# Patient Record
Sex: Female | Born: 1970 | Race: Asian | Hispanic: No | Marital: Married | Smoking: Never smoker
Health system: Southern US, Community
[De-identification: ages and names within clinical notes are randomized; demographics above are authoritative.]

## PROBLEM LIST (undated history)

## (undated) DIAGNOSIS — E079 Disorder of thyroid, unspecified: Secondary | ICD-10-CM

## (undated) DIAGNOSIS — K769 Liver disease, unspecified: Secondary | ICD-10-CM

## (undated) HISTORY — DX: Liver disease, unspecified: K76.9

## (undated) HISTORY — DX: Disorder of thyroid, unspecified: E07.9

---

## 1996-07-05 HISTORY — PX: MYOMECTOMY: SHX85

## 2007-09-01 ENCOUNTER — Emergency Department (HOSPITAL_COMMUNITY): Admission: EM | Admit: 2007-09-01 | Discharge: 2007-09-01 | Payer: Self-pay | Admitting: Emergency Medicine

## 2008-10-25 ENCOUNTER — Ambulatory Visit (HOSPITAL_COMMUNITY): Admission: RE | Admit: 2008-10-25 | Discharge: 2008-10-25 | Payer: Self-pay | Admitting: Gastroenterology

## 2009-09-25 ENCOUNTER — Encounter: Admission: RE | Admit: 2009-09-25 | Discharge: 2009-09-25 | Payer: Self-pay | Admitting: Obstetrics and Gynecology

## 2009-10-01 ENCOUNTER — Encounter: Admission: RE | Admit: 2009-10-01 | Discharge: 2009-10-01 | Payer: Self-pay | Admitting: Obstetrics and Gynecology

## 2010-07-26 ENCOUNTER — Encounter: Payer: Self-pay | Admitting: Obstetrics and Gynecology

## 2010-08-26 ENCOUNTER — Other Ambulatory Visit: Payer: Self-pay | Admitting: Obstetrics and Gynecology

## 2010-08-26 DIAGNOSIS — Z1231 Encounter for screening mammogram for malignant neoplasm of breast: Secondary | ICD-10-CM

## 2010-10-05 ENCOUNTER — Ambulatory Visit: Payer: Self-pay

## 2010-10-07 ENCOUNTER — Ambulatory Visit
Admission: RE | Admit: 2010-10-07 | Discharge: 2010-10-07 | Disposition: A | Discharge status: Home / Self Care | Payer: BC Managed Care – PPO | Source: Ambulatory Visit | Attending: Obstetrics and Gynecology | Admitting: Obstetrics and Gynecology

## 2010-10-07 DIAGNOSIS — Z1231 Encounter for screening mammogram for malignant neoplasm of breast: Secondary | ICD-10-CM

## 2011-09-07 ENCOUNTER — Other Ambulatory Visit: Payer: Self-pay | Admitting: Obstetrics and Gynecology

## 2011-09-07 DIAGNOSIS — Z1231 Encounter for screening mammogram for malignant neoplasm of breast: Secondary | ICD-10-CM

## 2011-09-28 ENCOUNTER — Other Ambulatory Visit: Payer: Self-pay | Admitting: Obstetrics and Gynecology

## 2011-09-28 ENCOUNTER — Other Ambulatory Visit (HOSPITAL_COMMUNITY)
Admission: RE | Admit: 2011-09-28 | Discharge: 2011-09-28 | Disposition: A | Discharge status: Home / Self Care | Payer: BC Managed Care – PPO | Source: Ambulatory Visit | Attending: Obstetrics and Gynecology | Admitting: Obstetrics and Gynecology

## 2011-09-28 DIAGNOSIS — Z124 Encounter for screening for malignant neoplasm of cervix: Secondary | ICD-10-CM | POA: Insufficient documentation

## 2011-09-28 DIAGNOSIS — Z113 Encounter for screening for infections with a predominantly sexual mode of transmission: Secondary | ICD-10-CM | POA: Insufficient documentation

## 2011-10-11 ENCOUNTER — Ambulatory Visit: Payer: BC Managed Care – PPO

## 2011-10-12 ENCOUNTER — Ambulatory Visit: Payer: BC Managed Care – PPO

## 2011-10-13 ENCOUNTER — Ambulatory Visit
Admission: RE | Admit: 2011-10-13 | Discharge: 2011-10-13 | Disposition: A | Discharge status: Home / Self Care | Payer: BC Managed Care – PPO | Source: Ambulatory Visit | Attending: Obstetrics and Gynecology | Admitting: Obstetrics and Gynecology

## 2011-10-13 DIAGNOSIS — Z1231 Encounter for screening mammogram for malignant neoplasm of breast: Secondary | ICD-10-CM

## 2013-02-15 ENCOUNTER — Other Ambulatory Visit: Payer: Self-pay | Admitting: Family Medicine

## 2013-02-15 DIAGNOSIS — Z1231 Encounter for screening mammogram for malignant neoplasm of breast: Secondary | ICD-10-CM

## 2013-02-15 DIAGNOSIS — IMO0002 Reserved for concepts with insufficient information to code with codable children: Secondary | ICD-10-CM

## 2013-02-16 ENCOUNTER — Ambulatory Visit
Admission: RE | Admit: 2013-02-16 | Discharge: 2013-02-16 | Disposition: A | Discharge status: Home / Self Care | Payer: No Typology Code available for payment source | Source: Ambulatory Visit | Attending: Family Medicine | Admitting: Family Medicine

## 2013-02-16 ENCOUNTER — Other Ambulatory Visit: Payer: BC Managed Care – PPO

## 2013-02-16 DIAGNOSIS — IMO0002 Reserved for concepts with insufficient information to code with codable children: Secondary | ICD-10-CM

## 2013-02-28 ENCOUNTER — Ambulatory Visit: Payer: BC Managed Care – PPO

## 2013-02-28 ENCOUNTER — Ambulatory Visit
Admission: RE | Admit: 2013-02-28 | Discharge: 2013-02-28 | Disposition: A | Discharge status: Home / Self Care | Payer: No Typology Code available for payment source | Source: Ambulatory Visit | Attending: Family Medicine | Admitting: Family Medicine

## 2013-02-28 DIAGNOSIS — Z1231 Encounter for screening mammogram for malignant neoplasm of breast: Secondary | ICD-10-CM

## 2013-08-24 ENCOUNTER — Other Ambulatory Visit: Payer: Self-pay | Admitting: Endocrinology

## 2013-08-24 DIAGNOSIS — E049 Nontoxic goiter, unspecified: Secondary | ICD-10-CM

## 2013-09-03 ENCOUNTER — Ambulatory Visit
Admission: RE | Admit: 2013-09-03 | Discharge: 2013-09-03 | Disposition: A | Discharge status: Home / Self Care | Payer: Managed Care, Other (non HMO) | Source: Ambulatory Visit | Attending: Endocrinology | Admitting: Endocrinology

## 2013-09-03 DIAGNOSIS — E049 Nontoxic goiter, unspecified: Secondary | ICD-10-CM

## 2013-10-31 ENCOUNTER — Other Ambulatory Visit: Payer: Self-pay

## 2013-10-31 DIAGNOSIS — Z1231 Encounter for screening mammogram for malignant neoplasm of breast: Secondary | ICD-10-CM

## 2014-03-01 ENCOUNTER — Ambulatory Visit
Admission: RE | Admit: 2014-03-01 | Discharge: 2014-03-01 | Disposition: A | Discharge status: Home / Self Care | Payer: Managed Care, Other (non HMO) | Source: Ambulatory Visit

## 2014-03-01 ENCOUNTER — Encounter (INDEPENDENT_AMBULATORY_CARE_PROVIDER_SITE_OTHER): Payer: Self-pay

## 2014-03-01 DIAGNOSIS — Z1231 Encounter for screening mammogram for malignant neoplasm of breast: Secondary | ICD-10-CM

## 2015-06-03 ENCOUNTER — Other Ambulatory Visit: Payer: Self-pay

## 2015-06-03 DIAGNOSIS — Z1231 Encounter for screening mammogram for malignant neoplasm of breast: Secondary | ICD-10-CM

## 2015-06-09 ENCOUNTER — Ambulatory Visit
Admission: RE | Admit: 2015-06-09 | Discharge: 2015-06-09 | Disposition: A | Discharge status: Home / Self Care | Payer: 59 | Source: Ambulatory Visit

## 2015-06-09 DIAGNOSIS — Z1231 Encounter for screening mammogram for malignant neoplasm of breast: Secondary | ICD-10-CM

## 2016-06-11 ENCOUNTER — Other Ambulatory Visit: Payer: Self-pay | Admitting: Internal Medicine

## 2016-06-11 ENCOUNTER — Ambulatory Visit (INDEPENDENT_AMBULATORY_CARE_PROVIDER_SITE_OTHER): Payer: 59 | Admitting: Internal Medicine

## 2016-06-11 ENCOUNTER — Ambulatory Visit
Admission: RE | Admit: 2016-06-11 | Discharge: 2016-06-11 | Disposition: A | Discharge status: Home / Self Care | Payer: 59 | Source: Ambulatory Visit | Attending: Internal Medicine | Admitting: Internal Medicine

## 2016-06-11 ENCOUNTER — Other Ambulatory Visit: Payer: Self-pay | Admitting: Obstetrics and Gynecology

## 2016-06-11 ENCOUNTER — Ambulatory Visit
Admission: RE | Admit: 2016-06-11 | Discharge: 2016-06-11 | Disposition: A | Discharge status: Home / Self Care | Payer: 59 | Source: Ambulatory Visit | Attending: Obstetrics and Gynecology | Admitting: Obstetrics and Gynecology

## 2016-06-11 ENCOUNTER — Encounter: Payer: Self-pay | Admitting: Internal Medicine

## 2016-06-11 VITALS — BP 113/72 | HR 72 | Ht 66.0 in | Wt 149.0 lb

## 2016-06-11 DIAGNOSIS — E042 Nontoxic multinodular goiter: Secondary | ICD-10-CM

## 2016-06-11 DIAGNOSIS — E0590 Thyrotoxicosis, unspecified without thyrotoxic crisis or storm: Secondary | ICD-10-CM | POA: Diagnosis not present

## 2016-06-11 DIAGNOSIS — Z1231 Encounter for screening mammogram for malignant neoplasm of breast: Secondary | ICD-10-CM

## 2016-06-11 DIAGNOSIS — R635 Abnormal weight gain: Secondary | ICD-10-CM

## 2016-06-11 DIAGNOSIS — E0510 Thyrotoxicosis with toxic single thyroid nodule without thyrotoxic crisis or storm: Secondary | ICD-10-CM | POA: Diagnosis not present

## 2016-06-11 LAB — T4, FREE: Free T4: 0.94 ng/dL (ref 0.60–1.60)

## 2016-06-11 LAB — TSH: TSH: 0.41 u[IU]/mL (ref 0.35–4.50)

## 2016-06-11 LAB — T3, FREE: T3, Free: 3 pg/mL (ref 2.3–4.2)

## 2016-06-11 NOTE — Progress Notes (Signed)
Patient ID: Lacey White, female   DOB: Nov 15, 1970, 45 y.o.   MRN: [id number]    HPI  Lacey White is a 45 y.o.-year-old female, referred by her PCP, Dr. Christain [place], for evaluation for subclinical thyrotoxicosis, toxic L adenoma, and multinodular thyroid.  I reviewed pt's thyroid tests: 03/18/2016: TSH 0.153 (0.2-4.5), free T4 1.1, free T3 3.57 03/18/2015: TSH 0.54 , free T4 1.39, free T3 3.07  08/23/2014: TSH 1.29, free T4 1.34, free T3 2.94 05/08/2014: TSH 0.75, free T4 1.25, free T3 2.87 02/20/2014: TSH 0.29, free T4 1.16, free T3 2.88 05/02/2013: TSH 0.86 (0.45-4.5), free T4 1.41, free T3 2.73  Thyroid Uptake and scan (03/26/2014): normal uptake, 20.9%; L hot nodule  Latest thyroid U/S (09/03/2013):  Right thyroid lobe: 66 x 14 x 21 mm. Innumerable hypoechoic/ cystic nodules.  7 x 5.5 x 8.8 mm superior pole - largest 9 x 7 x 11 mm mid lobe 6 x 8 x 8 mm inferior pole Left thyroid lobe: 67 x 14 x 24 mm. Multiple hypoechoic/cystic nodules. 10 x 5 x 10 mm complex, mid lobe - largest 9 x 9 x 9 mm solid, inferior pole Isthmus Thickness: 2.3 mm.  No nodules visualized. Lymphadenopathy: None visualized.  Pt denies feeling nodules in neck, hoarseness, dysphagia/odynophagia, SOB with lying down; she c/o: - + anxiety - + weight gain (14 lbs in last 2 years) - no  fatigue - excessive sweating/heat intolerance - no tremors - no palpitations - no hyperdefecation - + hair loss  Pt does not have a FH of thyroid ds. No FH of thyroid cancer. No h/o radiation tx to head or neck.  No seaweed or kelp now, but was eating more in the recent past - stopped 1 mo ago, no recent contrast studies. No steroid use. No herbal supplements. No Biotin use.  Pt. also has a history of Hep B - Dr. Posey Pronto.   ROS: Constitutional: + see HPI Eyes: no blurry vision, no xerophthalmia ENT: + sore throat,see HPI Cardiovascular: + CP (asthma)/SOB/palpitations/leg swelling Respiratory: no cough/no  SOB Gastrointestinal: no N/V/D/C Musculoskeletal: no muscle/joint aches Skin: no rashes, + hair loss Neurological: no tremors/numbness/tingling/dizziness Psychiatric: no depression/+ anxiety  Past Medical History:  Diagnosis Date  . Liver problem   . Thyroid disease    Past Surgical History:  Procedure Laterality Date  . MYOMECTOMY  1998   Social History   Social History  . Marital status: Married    Spouse name: N/A  . Number of children: 2   Occupational History  . Housekeeping   Social History Main Topics  . Smoking status: Never Smoker  . Smokeless tobacco: Never Used  . Alcohol use No  . Drug use: No   Prior to Admission medications   Medication Sig Start Date End Date Taking? Authorizing Provider  tenofovir (VIREAD) 300 MG tablet Take 300 mg by mouth daily.   Yes Historical Provider, MD   Allergies  Allergen Reactions  . Other     TREE ALLERGY   No family history on file.  PE: BP 113/72   Pulse 72   Ht 5\' 6"  (1.676 m)   Wt 149 lb (67.6 kg)   BMI 24.05 kg/m  Wt Readings from Last 3 Encounters:  06/11/16 149 lb (67.6 kg)   Constitutional: normal weight, in NAD Eyes: PERRLA, EOMI ENT: moist mucous membranes, no thyromegaly, no cervical lymphadenopathy Cardiovascular: RRR, No MRG Respiratory: CTA B Gastrointestinal: abdomen soft, NT, ND, BS+ Musculoskeletal: no deformities, strength intact in  all 4 Skin: moist, warm, no rashes Neurological: no tremor with outstretched hands, DTR normal in all 4  ASSESSMENT: 1. Subclinical Hyperthyroidism  2. Toxic L adenoma  3. Multinodular thyroid  4. Weight gain  PLAN:  1. And 2. Patient with mild intermittent subclinical hyperthyroidism without thyrotoxic sxs, except for anxiety.  She has a history of mildly decreased TSH in 2015, after which she had a thyroid uptake and scan indicating a left toxic adenoma. A thyroid ultrasound showed multiple small thyroid nodules. Her TFTs normalized afterwards until  03/2016, when the TSH again returned mildly low, with normal free T4 and free T3. I explained that she has mild subclinical hyperthyroidism, most likely due to her toxic adenoma. - I suggested that we check the TSH, fT3 and fT4  - If the labs are normal, she only needs follow-up, without intervention, especially since she is asymptomatic. If the labs returned abnormal and worsening, we discussed about possible modalities of treatment for toxic adenoma, to include radioactive iodine ablation or (last resort) surgery. We discussed about the possible need for levothyroxine after RAI treatment or surgery. - I do not feel that we need to add beta blockers at this time, since she is not tachycardic, anxious, or tremulous - I advised her to join my chart to communicate easier - RTC in 6 months, but likely sooner for repeat labs  3. Multinodular thyroid - She has no neck compression symptoms - I will continue to keep an eye on the thyroid nodules, with another ultrasound next year, however, no intervention needed for now, since the nodules are small.  4. Weight gain - We discussed that her weight gain is unlikely to be related to her thyroid condition - Will refer her to nutrition  Office Visit on 06/11/2016  Component Date Value Ref Range Status  . Free T4 06/11/2016 0.94  0.60 - 1.60 ng/dL Final   Comment: Specimens from patients who are undergoing biotin therapy and /or ingesting biotin supplements may contain high levels of biotin.  The higher biotin concentration in these specimens interferes with this Free T4 assay.  Specimens that contain high levels  of biotin may cause false high results for this Free T4 assay.  Please interpret results in light of the total clinical presentation of the patient.    . T3, Free 06/11/2016 3.0  2.3 - 4.2 pg/mL Final  . TSH 06/11/2016 0.41  0.35 - 4.50 uIU/mL Final   Thyroid tests have normalized. I would suggest only continue to follow her for now, without  intervention. I will repeat her testing 6 months.  [name], MD PhD [facility] Endocrinology

## 2016-06-11 NOTE — Patient Instructions (Signed)
You have subclinical hyperthyroidism (mild thyroid overactivity).  Please stop at the lab.  Please come back in 6 months.

## 2016-06-14 ENCOUNTER — Telehealth: Payer: Self-pay

## 2016-06-14 NOTE — Telephone Encounter (Signed)
Called patient. Gave lab results. Patient verbalized understanding.  Resent MyChart activation info.

## 2016-06-14 NOTE — Telephone Encounter (Signed)
-----   Message from [name], MD sent at 06/14/2016 12:39 PM EST ----- Lacey White, can you please call pt: Thyroid tests have normalized. I would suggest only continue to follow her for now, without intervention. I will repeat her tests in 6 months.

## 2016-07-13 ENCOUNTER — Ambulatory Visit: Payer: 59

## 2016-07-22 ENCOUNTER — Ambulatory Visit: Payer: 59 | Admitting: Skilled Nursing Facility1

## 2016-07-23 DIAGNOSIS — Z23 Encounter for immunization: Secondary | ICD-10-CM | POA: Diagnosis not present

## 2016-08-12 ENCOUNTER — Ambulatory Visit: Payer: 59 | Admitting: Registered"

## 2016-08-19 DIAGNOSIS — C220 Liver cell carcinoma: Secondary | ICD-10-CM | POA: Diagnosis not present

## 2016-10-05 DIAGNOSIS — N926 Irregular menstruation, unspecified: Secondary | ICD-10-CM | POA: Diagnosis not present

## 2016-10-05 DIAGNOSIS — E0510 Thyrotoxicosis with toxic single thyroid nodule without thyrotoxic crisis or storm: Secondary | ICD-10-CM | POA: Diagnosis not present

## 2016-10-05 DIAGNOSIS — E042 Nontoxic multinodular goiter: Secondary | ICD-10-CM | POA: Diagnosis not present

## 2016-10-05 DIAGNOSIS — R946 Abnormal results of thyroid function studies: Secondary | ICD-10-CM | POA: Diagnosis not present

## 2016-10-07 DIAGNOSIS — N926 Irregular menstruation, unspecified: Secondary | ICD-10-CM | POA: Diagnosis not present

## 2016-10-07 DIAGNOSIS — J302 Other seasonal allergic rhinitis: Secondary | ICD-10-CM | POA: Diagnosis not present

## 2016-10-19 DIAGNOSIS — E0590 Thyrotoxicosis, unspecified without thyrotoxic crisis or storm: Secondary | ICD-10-CM | POA: Diagnosis not present

## 2016-10-19 DIAGNOSIS — E0510 Thyrotoxicosis with toxic single thyroid nodule without thyrotoxic crisis or storm: Secondary | ICD-10-CM | POA: Diagnosis not present

## 2016-10-19 DIAGNOSIS — E042 Nontoxic multinodular goiter: Secondary | ICD-10-CM | POA: Diagnosis not present

## 2016-12-10 ENCOUNTER — Telehealth: Payer: Self-pay | Admitting: Internal Medicine

## 2016-12-10 ENCOUNTER — Encounter: Payer: Self-pay | Admitting: Internal Medicine

## 2016-12-10 ENCOUNTER — Ambulatory Visit (INDEPENDENT_AMBULATORY_CARE_PROVIDER_SITE_OTHER): Payer: 59 | Admitting: Internal Medicine

## 2016-12-10 VITALS — BP 102/60 | HR 82 | Wt 141.0 lb

## 2016-12-10 DIAGNOSIS — E0590 Thyrotoxicosis, unspecified without thyrotoxic crisis or storm: Secondary | ICD-10-CM | POA: Diagnosis not present

## 2016-12-10 DIAGNOSIS — E0510 Thyrotoxicosis with toxic single thyroid nodule without thyrotoxic crisis or storm: Secondary | ICD-10-CM | POA: Diagnosis not present

## 2016-12-10 DIAGNOSIS — E042 Nontoxic multinodular goiter: Secondary | ICD-10-CM | POA: Diagnosis not present

## 2016-12-10 LAB — T4, FREE: FREE T4: 0.96 ng/dL (ref 0.60–1.60)

## 2016-12-10 LAB — TSH: TSH: 0.61 u[IU]/mL (ref 0.35–4.50)

## 2016-12-10 LAB — T3, FREE: T3 FREE: 3.1 pg/mL (ref 2.3–4.2)

## 2016-12-10 NOTE — Patient Instructions (Addendum)
Please stop at the lab.  You can start Hair Skin and Nails vitamins but don't forget to stop 5 days before our next visit or any thyroid labs.   Please come back for a follow-up appointment in 6 months.

## 2016-12-10 NOTE — Telephone Encounter (Signed)
Patient needs order to get her yearly ultrasound for her Thyroid.   Dr. Cruzita Lederer did not put one in for her this year.  Please call back to discuss.  Thank you,  -LL

## 2016-12-10 NOTE — Progress Notes (Signed)
Patient ID: Lacey White, female   DOB: Jul 02, 1971, 46 y.o.   MRN: [id number]    HPI  Lacey White is a 46 y.o.-year-old female, initially referred by her PCP, Dr. Christain [place], now returning for subclinical thyrotoxicosis, toxic L adenoma, and multinodular thyroid.  Since last visit >> she lost 8 lbs >> intentional >> more active.  I reviewed pt's thyroid tests - last set normal: Lab Results  Component Value Date   TSH 0.41 06/11/2016   Lab Results  Component Value Date   FREET4 0.94 06/11/2016   Lab Results  Component Value Date   T3FREE 3.0 06/11/2016   Prev: 03/18/2016: TSH 0.153 (0.2-4.5), free T4 1.1, free T3 3.57 03/18/2015: TSH 0.54 , free T4 1.39, free T3 3.07  08/23/2014: TSH 1.29, free T4 1.34, free T3 2.94 05/08/2014: TSH 0.75, free T4 1.25, free T3 2.87 02/20/2014: TSH 0.29, free T4 1.16, free T3 2.88 05/02/2013: TSH 0.86 (0.45-4.5), free T4 1.41, free T3 2.73  Thyroid Uptake and scan (03/26/2014): normal uptake, 20.9%;L hot nodule  Latest thyroid U/S (09/03/2013):  Right thyroid lobe: 66 x 14 x 21 mm. Innumerable hypoechoic/ cystic nodules.  7 x 5.5 x 8.8 mm superior pole - largest 9 x 7 x 11 mm mid lobe 6 x 8 x 8 mm inferior pole Left thyroid lobe: 67 x 14 x 24 mm. Multiple hypoechoic/cystic nodules. 10 x 5 x 10 mm complex, mid lobe - largest 9 x 9 x 9 mm solid, inferior pole Isthmus Thickness: 2.3 mm.  No nodules visualized. Lymphadenopathy: None visualized.  Pt denies: - feeling nodules in neck - hoarseness - dysphagia - choking - SOB with lying down  Pt mentions - + weight loss - intentional - she goes with her son (28 y/o) to play golf (she pushes the cart) - no heat intolerance - no tremors - no palpitations - + anxiety - a little better - hyperdefecation - + hair loss - + skipped 03 and 10/2016 cycles - had a longer period of bleeding in 11/2016.  Pt does not have a FH of thyroid ds. No FH of thyroid cancer. No h/o radiation tx to head or  neck.  No recent contrast studies. No herbal supplements. No Biotin use. No recent steroids use.   No seaweed or kelp now, but was eating more in the recent past - stopped 1 mo before last visit  Pt. also has a history of Hep B - Dr. Posey Pronto.   ROS: Constitutional: see HPi Eyes: no blurry vision, no xerophthalmia ENT: no sore throat, no nodules palpated in throat, no dysphagia, no odynophagia, no hoarseness Cardiovascular: no CP/no SOB/no palpitations/no leg swelling Respiratory: no cough/no SOB/no wheezing Gastrointestinal: no N/no V/no D/no C/no acid reflux Musculoskeletal: no muscle aches/no joint aches Skin: no rashes, + hair loss Neurological: no tremors/no numbness/no tingling/no dizziness  I reviewed pt's medications, allergies, PMH, social hx, family hx, and changes were documented in the history of present illness. Otherwise, unchanged from my initial visit note.   Past Medical History:  Diagnosis Date  . Liver problem   . Thyroid disease    Past Surgical History:  Procedure Laterality Date  . MYOMECTOMY  1998   Social History   Social History  . Marital status: Married    Spouse name: N/A  . Number of children: 2   Occupational History  . Housekeeping   Social History Main Topics  . Smoking status: Never Smoker  . Smokeless tobacco: Never Used  .  Alcohol use No  . Drug use: No   Prior to Admission medications   Medication Sig Start Date End Date Taking? Authorizing Provider  tenofovir (VIREAD) 300 MG tablet Take 300 mg by mouth daily.   Yes Historical Provider, MD   Allergies  Allergen Reactions  . Other     TREE ALLERGY   No family history on file.  PE: BP 102/60 (BP Location: Left Arm, Patient Position: Sitting)   Pulse 82   Wt 141 lb (64 kg)   LMP 11/29/2016   SpO2 98%   BMI 22.76 kg/m  Wt Readings from Last 3 Encounters:  12/10/16 141 lb (64 kg)  06/11/16 149 lb (67.6 kg)   Constitutional: Normal weight, in NAD Eyes: PERRLA, EOMI, no  exophthalmos ENT: moist mucous membranes, no thyromegaly, no cervical lymphadenopathy Cardiovascular: RRR, No MRG Respiratory: CTA B Gastrointestinal: abdomen soft, NT, ND, BS+ Musculoskeletal: no deformities, strength intact in all 4 Skin: moist, warm, no rashes Neurological: no tremor with outstretched hands, DTR normal in all 4   ASSESSMENT: 1. Subclinical Hyperthyroidism  2. Toxic L adenoma  3. Multinodular thyroid  PLAN:  1. And 2. Patient with mild intermittent subclinical hyperthyroidism without thyrotoxic sxs, except for slight anxiety. She also has irregular menstrual cycles now, but she is likely perimenopausal. She had an intentional weight loss of 8 pounds since last visit after being more active. - We discussed about the results of her thyroid uptake and scan indicating the left mildly toxic adenoma, however, her TFTs are either normal or her TSH is very slightly low. At last visit, her TFTs were all normal. We discussed for now to continue to follow her, but I would not suggest treatment at the moment (standard of care in this situation is RAI treatment), since that would most likely commit her to lifelong levothyroxine treatment. She agrees with the plan.  - today we will check TSH, fT3 and fT4  - I do not feel the need to add beta blockers at this time, since she is not tachycardic or tremulous. - RTC in 6 months  3. Multinodular thyroid - no neck compression sxs - reviewed the last U/S results >> nodules are small >> will continue to keep an eye on them >> will need a new U/S at next visit  Office Visit on 12/10/2016  Component Date Value Ref Range Status  . TSH 12/10/2016 0.61  0.35 - 4.50 uIU/mL Final  . Free T4 12/10/2016 0.96  0.60 - 1.60 ng/dL Final   Comment: Specimens from patients who are undergoing biotin therapy and /or ingesting biotin supplements may contain high levels of biotin.  The higher biotin concentration in these specimens interferes with this  Free T4 assay.  Specimens that contain high levels  of biotin may cause false high results for this Free T4 assay.  Please interpret results in light of the total clinical presentation of the patient.    . T3, Free 12/10/2016 3.1  2.3 - 4.2 pg/mL Final   Thyroid tests are all normal.  [name], MD PhD [facility] Endocrinology

## 2016-12-13 ENCOUNTER — Telehealth: Payer: Self-pay

## 2016-12-13 ENCOUNTER — Other Ambulatory Visit: Payer: Self-pay | Admitting: Internal Medicine

## 2016-12-13 NOTE — Telephone Encounter (Signed)
Called and LVM advising patient of Dr.Gherghe's note. Left call back number if any questions.   

## 2016-12-13 NOTE — Telephone Encounter (Signed)
Please advise. Thank you

## 2016-12-13 NOTE — Telephone Encounter (Signed)
Please see my last OV note: - reviewed the last U/S results >> nodules are small >> will continue to keep an eye on them >> will need a new U/S at next visit  The reason for this is that I would like her to be euthyroid for 1 year when we get the new ultrasound. Her last abnormal TFTs were in 03/2016, so I plan to order her next ultrasound when she comes back.

## 2017-03-15 DIAGNOSIS — R5383 Other fatigue: Secondary | ICD-10-CM | POA: Diagnosis not present

## 2017-03-15 DIAGNOSIS — E0510 Thyrotoxicosis with toxic single thyroid nodule without thyrotoxic crisis or storm: Secondary | ICD-10-CM | POA: Diagnosis not present

## 2017-03-15 DIAGNOSIS — J029 Acute pharyngitis, unspecified: Secondary | ICD-10-CM | POA: Diagnosis not present

## 2017-03-15 DIAGNOSIS — E042 Nontoxic multinodular goiter: Secondary | ICD-10-CM | POA: Diagnosis not present

## 2017-03-15 DIAGNOSIS — R6883 Chills (without fever): Secondary | ICD-10-CM | POA: Diagnosis not present

## 2017-03-16 DIAGNOSIS — R7301 Impaired fasting glucose: Secondary | ICD-10-CM | POA: Diagnosis not present

## 2017-06-08 ENCOUNTER — Other Ambulatory Visit: Payer: Self-pay | Admitting: Nurse Practitioner

## 2017-06-08 DIAGNOSIS — Z1231 Encounter for screening mammogram for malignant neoplasm of breast: Secondary | ICD-10-CM

## 2017-06-10 ENCOUNTER — Ambulatory Visit: Payer: 59 | Admitting: Internal Medicine

## 2017-07-11 ENCOUNTER — Telehealth: Payer: Self-pay | Admitting: Internal Medicine

## 2017-07-11 ENCOUNTER — Other Ambulatory Visit: Payer: Self-pay | Admitting: Internal Medicine

## 2017-07-11 ENCOUNTER — Ambulatory Visit: Payer: 59 | Admitting: Internal Medicine

## 2017-07-11 ENCOUNTER — Ambulatory Visit (INDEPENDENT_AMBULATORY_CARE_PROVIDER_SITE_OTHER): Payer: 59 | Admitting: Internal Medicine

## 2017-07-11 ENCOUNTER — Encounter: Payer: Self-pay | Admitting: Internal Medicine

## 2017-07-11 VITALS — BP 100/60 | HR 80 | Ht 67.25 in | Wt 153.0 lb

## 2017-07-11 DIAGNOSIS — E042 Nontoxic multinodular goiter: Secondary | ICD-10-CM | POA: Diagnosis not present

## 2017-07-11 DIAGNOSIS — Z6824 Body mass index (BMI) 24.0-24.9, adult: Secondary | ICD-10-CM | POA: Diagnosis not present

## 2017-07-11 DIAGNOSIS — Z124 Encounter for screening for malignant neoplasm of cervix: Secondary | ICD-10-CM | POA: Diagnosis not present

## 2017-07-11 DIAGNOSIS — Z01419 Encounter for gynecological examination (general) (routine) without abnormal findings: Secondary | ICD-10-CM | POA: Diagnosis not present

## 2017-07-11 DIAGNOSIS — E0510 Thyrotoxicosis with toxic single thyroid nodule without thyrotoxic crisis or storm: Secondary | ICD-10-CM

## 2017-07-11 DIAGNOSIS — E0590 Thyrotoxicosis, unspecified without thyrotoxic crisis or storm: Secondary | ICD-10-CM

## 2017-07-11 LAB — T4, FREE: Free T4: 0.76 ng/dL (ref 0.60–1.60)

## 2017-07-11 LAB — T3, FREE: T3, Free: 3 pg/mL (ref 2.3–4.2)

## 2017-07-11 LAB — TSH: TSH: 0.45 u[IU]/mL (ref 0.35–4.50)

## 2017-07-11 NOTE — Telephone Encounter (Signed)
Unfortunately, she has no violent is another medical system, I cannot order an ultrasound there.  Can she get her PCP to order it there?

## 2017-07-11 NOTE — Patient Instructions (Signed)
Please stop at the lab.  Ordered a new thyroid ultrasound for you.  You will be called to schedule this.  Please come back for another visit in 1 year but for a new set of labs in 6 months.

## 2017-07-11 NOTE — Progress Notes (Addendum)
Patient ID: Lacey White, female   DOB: 1970-07-14, 47 y.o.   MRN: [id number]    HPI  Lacey White is a 47 y.o.-year-old female, initially referred by her PCP, Dr. Christain Sacramento, now returning for subclinical thyrotoxicosis, toxic L adenoma, and multinodular thyroid.  Last visit 6 months ago.  Since last visit >> she gained 12 pounds. She also c/o more stress, abdominal discomfort.  I reviewed pt's thyroid tests - last 2 sets normal: Lab Results  Component Value Date   TSH 0.61 12/10/2016   TSH 0.41 06/11/2016   Lab Results  Component Value Date   FREET4 0.96 12/10/2016   FREET4 0.94 06/11/2016   Lab Results  Component Value Date   T3FREE 3.1 12/10/2016   T3FREE 3.0 06/11/2016   Prev: 03/18/2016: TSH 0.153 (0.2-4.5), free T4 1.1, free T3 3.57 03/18/2015: TSH 0.54 , free T4 1.39, free T3 3.07  08/23/2014: TSH 1.29, free T4 1.34, free T3 2.94 05/08/2014: TSH 0.75, free T4 1.25, free T3 2.87 02/20/2014: TSH 0.29, free T4 1.16, free T3 2.88 05/02/2013: TSH 0.86 (0.45-4.5), free T4 1.41, free T3 2.73  Thyroid Uptake and scan (03/26/2014): normal uptake, 20.9%; left hot nodule  Latest thyroid U/S (09/03/2013):  Right thyroid lobe: 66 x 14 x 21 mm. Innumerable hypoechoic/ cystic nodules.  7 x 5.5 x 8.8 mm superior pole - largest 9 x 7 x 11 mm mid lobe 6 x 8 x 8 mm inferior pole Left thyroid lobe: 67 x 14 x 24 mm. Multiple hypoechoic/cystic nodules. 10 x 5 x 10 mm complex, mid lobe - largest 9 x 9 x 9 mm solid, inferior pole Isthmus Thickness: 2.3 mm.  No nodules visualized. Lymphadenopathy: None visualized.  Pt denies: - feeling nodules in neck - hoarseness - dysphagia - choking - SOB with lying down  Pt does not have a FH of thyroid ds. No FH of thyroid cancer. No h/o radiation tx to head or neck.  No seaweed or kelp. No recent contrast studies. No herbal supplements. No Biotin use. No recent steroids use.   Pt. also has a history of Hep B - Dr. Posey Pronto.    ROS: Constitutional: no weight gain/no weight loss, no fatigue, no subjective hyperthermia, no subjective hypothermia Eyes: no blurry vision, no xerophthalmia ENT: no sore throat, + see HPI Cardiovascular: no CP/no SOB/no palpitations/no leg swelling Respiratory: no cough/no SOB/no wheezing Gastrointestinal: no N/no V/no D/no C/no acid reflux Musculoskeletal: no muscle aches/no joint aches Skin: no rashes, no hair loss Neurological: no tremors/no numbness/no tingling/no dizziness  I reviewed pt's medications, allergies, PMH, social hx, family hx, and changes were documented in the history of present illness. Otherwise, unchanged from my initial visit note.   Past Medical History:  Diagnosis Date  . Liver problem   . Thyroid disease    Social History   Social History  . Marital status: Married    Spouse name: N/A  . Number of children: 2   Occupational History  . Housekeeping   Social History Main Topics  . Smoking status: Never Smoker  . Smokeless tobacco: Never Used  . Alcohol use No  . Drug use: No   Current Outpatient Medications  Medication Sig Dispense Refill  . Tenofovir Alafenamide Fumarate 25 MG TABS Take by mouth.     No current facility-administered medications for this visit.    Allergies  Allergen Reactions  . Other     TREE ALLERGY   No family history on file.  PE: BP  100/60   Pulse 80   Ht 5' 7.25" (1.708 m)   Wt 153 lb (69.4 kg)   LMP 07/01/2017   SpO2 98%   BMI 23.79 kg/m  Wt Readings from Last 3 Encounters:  07/11/17 153 lb (69.4 kg)  12/10/16 141 lb (64 kg)  06/11/16 149 lb (67.6 kg)   Constitutional: Normal weight, in NAD Eyes: PERRLA, EOMI, no exophthalmos ENT: moist mucous membranes, no thyromegaly, no cervical lymphadenopathy Cardiovascular: RRR, No MRG Respiratory: CTA B Gastrointestinal: abdomen soft, NT, ND, BS+ Musculoskeletal: no deformities, strength intact in all 4 Skin: moist, warm, no rashes Neurological: no  tremor with outstretched hands, DTR normal in all 4   ASSESSMENT: 1. Subclinical Hyperthyroidism  2. Toxic L adenoma  3. Multinodular thyroid  PLAN:  1. And 2.  Patient with mild intermittent subclinical hyperthyroidism, without thyrotoxic symptoms, except  for slight anxiety.  She also has irregular menstrual cycles now, but she is likely perimenopausal.  At last visit, she had an 8 pound weight loss, however, since last visit, she gained 12 pounds. - We again discussed about the results of her thyroid uptake and scan indicating left mildly toxic adenoma, however, she had normal TFTs at last visit and previously her TSH was very slightly low.  Therefore, we did not proceed with RAI treatment - today we will check TSH, free T4, free T3 - I do not feel we need to add beta-blockers at this time, since she is not tachycardic or tremulous - RTC in 6 months for labs and in 1 year for a visit  3. Multinodular thyroid - No neck compression symptoms - Reviewed together the latest ultrasound results >> nodules are small >> will order a new ultrasound now.  Office Visit on 07/11/2017  Component Date Value Ref Range Status  . TSH 07/11/2017 0.45  0.35 - 4.50 uIU/mL Final  . T3, Free 07/11/2017 3.0  2.3 - 4.2 pg/mL Final  . Free T4 07/11/2017 0.76  0.60 - 1.60 ng/dL Final   Comment: Specimens from patients who are undergoing biotin therapy and /or ingesting biotin supplements may contain high levels of biotin.  The higher biotin concentration in these specimens interferes with this Free T4 assay.  Specimens that contain high levels  of biotin may cause false high results for this Free T4 assay.  Please interpret results in light of the total clinical presentation of the patient.    Normal TFTs.  Received the thyroid ultrasound report from [place] (07/18/2017) -we will scan report: Right thyroid lobe: 4.8 x 1.2 x 2.3 cm.   Mildly heterogeneous parenchyma.  Normal background vascularity. Index  hypoechoic nodule in the upper pole measuring 1 x 0.6 x 0.8 cm, unchanged. 2 additional similar sized hypoechoic nodules in the right lobe midpole region are also not significantly changed. Isthmus: 2 mm Left thyroid lobe: 5 x 1 x 2.3 cm. Mildly heterogeneous parenchyma with normal vascularity. Index nearly isoechoic lower pole nodule measuring 1.3 x 1.0 x 0.9 cm, unchanged. Additional slightly smaller nodules are also not significantly changed IMPRESSION: Overall stable appearance of multiple nodules.  [name], MD PhD [facility] Endocrinology

## 2017-07-11 NOTE — Telephone Encounter (Addendum)
Patient stated she would like to be referred to [facility]  for her Ultra Sound  Location [place] phone # [phone]

## 2017-07-11 NOTE — Telephone Encounter (Signed)
Please advise on below  

## 2017-07-12 NOTE — Telephone Encounter (Signed)
Pt is aware and is calling her insurance to see how much she will have to pay out of pocket for this before she makes the appointment.

## 2017-07-13 NOTE — Telephone Encounter (Signed)
Pt stated she is suppose to have ultrasound done. And wants to speak to someone about getting it done elsewhere. She knows she is suppose to get the PCP to order it to the location she is wanting to get done at, but isnt sure what to tell PCP to get it sent there    Please advise Thank you

## 2017-07-13 NOTE — Telephone Encounter (Signed)
Patient is returning your call.  

## 2017-07-13 NOTE — Telephone Encounter (Signed)
LMTCB

## 2017-07-14 ENCOUNTER — Other Ambulatory Visit: Payer: Self-pay | Admitting: Internal Medicine

## 2017-07-14 DIAGNOSIS — E042 Nontoxic multinodular goiter: Secondary | ICD-10-CM

## 2017-07-14 NOTE — Telephone Encounter (Signed)
Faxed

## 2017-07-14 NOTE — Telephone Encounter (Signed)
Patient is returning your call.  

## 2017-07-14 NOTE — Telephone Encounter (Signed)
LMTCB

## 2017-07-14 NOTE — Telephone Encounter (Signed)
Pt is returning your call

## 2017-07-14 NOTE — Telephone Encounter (Signed)
Order up front for pt

## 2017-07-14 NOTE — Telephone Encounter (Signed)
Pt states she can not use [facility] because it is to expensive with her deductible. She states that she can take a written RX for the ultra sound to [facility] and they do not "charge her deductible" and she doesn't have to pay them anything. Patient stated that she will ask her primary care but she does not want to she wants a written order from Dr. Cruzita Lederer. Please advise.

## 2017-07-14 NOTE — Telephone Encounter (Signed)
Dr. Marin Comment office called about pt. And needs labs faxed over for the pt.    Fax [phone] Attn: Dr. Marin Comment

## 2017-07-14 NOTE — Telephone Encounter (Signed)
Ok, I printed the order.

## 2017-07-18 ENCOUNTER — Ambulatory Visit
Admission: RE | Admit: 2017-07-18 | Discharge: 2017-07-18 | Disposition: A | Discharge status: Home / Self Care | Payer: 59 | Source: Ambulatory Visit | Attending: Nurse Practitioner | Admitting: Nurse Practitioner

## 2017-07-18 DIAGNOSIS — E042 Nontoxic multinodular goiter: Secondary | ICD-10-CM | POA: Diagnosis not present

## 2017-07-18 DIAGNOSIS — Z1231 Encounter for screening mammogram for malignant neoplasm of breast: Secondary | ICD-10-CM

## 2017-07-21 ENCOUNTER — Encounter: Payer: Self-pay | Admitting: Internal Medicine

## 2017-07-21 NOTE — Progress Notes (Signed)
Received the thyroid ultrasound report from [place] (07/18/2017) -we will scan report: Right thyroid lobe: 4.8 x 1.2 x 2.3 cm.   Mildly heterogeneous parenchyma.  Normal background vascularity. Index hypoechoic nodule in the upper pole measuring 1 x 0.6 x 0.8 cm, unchanged. 2 additional similar sized hypoechoic nodules in the right lobe midpole region are also not significantly changed. Isthmus: 2 mm Left thyroid lobe: 5 x 1 x 2.3 cm. Mildly heterogeneous parenchyma with normal vascularity. Index nearly isoechoic lower pole nodule measuring 1.3 x 1.0 x 0.9 cm, unchanged. Additional slightly smaller nodules are also not significantly changed IMPRESSION: Overall stable appearance of multiple nodules.

## 2017-11-14 DIAGNOSIS — J358 Other chronic diseases of tonsils and adenoids: Secondary | ICD-10-CM | POA: Diagnosis not present

## 2017-11-21 DIAGNOSIS — L84 Corns and callosities: Secondary | ICD-10-CM | POA: Diagnosis not present

## 2017-11-21 DIAGNOSIS — M7742 Metatarsalgia, left foot: Secondary | ICD-10-CM | POA: Diagnosis not present

## 2017-11-21 DIAGNOSIS — M7741 Metatarsalgia, right foot: Secondary | ICD-10-CM | POA: Diagnosis not present

## 2018-01-09 ENCOUNTER — Other Ambulatory Visit: Payer: 59

## 2018-01-18 DIAGNOSIS — T63481A Toxic effect of venom of other arthropod, accidental (unintentional), initial encounter: Secondary | ICD-10-CM | POA: Diagnosis not present

## 2018-04-10 DIAGNOSIS — N938 Other specified abnormal uterine and vaginal bleeding: Secondary | ICD-10-CM | POA: Diagnosis not present

## 2018-04-10 DIAGNOSIS — N939 Abnormal uterine and vaginal bleeding, unspecified: Secondary | ICD-10-CM | POA: Diagnosis not present

## 2018-07-10 ENCOUNTER — Encounter: Payer: Self-pay | Admitting: Internal Medicine

## 2018-07-10 ENCOUNTER — Ambulatory Visit: Payer: 59 | Admitting: Internal Medicine

## 2018-07-10 VITALS — BP 110/70 | HR 82 | Ht 66.5 in | Wt 150.0 lb

## 2018-07-10 DIAGNOSIS — E0510 Thyrotoxicosis with toxic single thyroid nodule without thyrotoxic crisis or storm: Secondary | ICD-10-CM | POA: Diagnosis not present

## 2018-07-10 DIAGNOSIS — E0590 Thyrotoxicosis, unspecified without thyrotoxic crisis or storm: Secondary | ICD-10-CM | POA: Diagnosis not present

## 2018-07-10 DIAGNOSIS — E042 Nontoxic multinodular goiter: Secondary | ICD-10-CM | POA: Diagnosis not present

## 2018-07-10 NOTE — Progress Notes (Signed)
Patient ID: Lacey White, female   DOB: 12-Sep-1970, 48 y.o.   MRN: [id number]    HPI  Lacey White is a 48 y.o.-year-old female, returning for follow-up for subclinical thyrotoxicosis, toxic L adenoma, and multinodular thyroid.  Last visit 1 year ago.  Reviewed patient's TFTs: Normal: Lab Results  Component Value Date   TSH 0.45 07/11/2017   TSH 0.61 12/10/2016   TSH 0.41 06/11/2016   Lab Results  Component Value Date   FREET4 0.76 07/11/2017   FREET4 0.96 12/10/2016   FREET4 0.94 06/11/2016   Lab Results  Component Value Date   T3FREE 3.0 07/11/2017   T3FREE 3.1 12/10/2016   T3FREE 3.0 06/11/2016   Prev: 03/18/2016: TSH 0.153 (0.2-4.5), free T4 1.1, free T3 3.57 03/18/2015: TSH 0.54 , free T4 1.39, free T3 3.07  08/23/2014: TSH 1.29, free T4 1.34, free T3 2.94 05/08/2014: TSH 0.75, free T4 1.25, free T3 2.87 02/20/2014: TSH 0.29, free T4 1.16, free T3 2.88 05/02/2013: TSH 0.86 (0.45-4.5), free T4 1.41, free T3 2.73  Thyroid Uptake and scan (03/26/2014): normal uptake, 20.9%; left hot nodule  Thyroid U/S (09/03/2013):  Right thyroid lobe: 66 x 14 x 21 mm. Innumerable hypoechoic/ cystic nodules.  7 x 5.5 x 8.8 mm superior pole - largest 9 x 7 x 11 mm mid lobe 6 x 8 x 8 mm inferior pole Left thyroid lobe: 67 x 14 x 24 mm. Multiple hypoechoic/cystic nodules. 10 x 5 x 10 mm complex, mid lobe - largest 9 x 9 x 9 mm solid, inferior pole Isthmus Thickness: 2.3 mm.  No nodules visualized. Lymphadenopathy: None visualized.  Received the thyroid ultrasound report from [place] (07/18/2017): Right thyroid lobe: 4.8 x 1.2 x 2.3 cm.   Mildly heterogeneous parenchyma.  Normal background vascularity. Index hypoechoic nodule in the upper pole measuring 1 x 0.6 x 0.8 cm, unchanged. 2 additional similar sized hypoechoic nodules in the right lobe midpole region are also not significantly changed. Isthmus: 2 mm Left thyroid lobe: 5 x 1 x 2.3 cm. Mildly heterogeneous parenchyma with  normal vascularity. Index nearly isoechoic lower pole nodule measuring 1.3 x 1.0 x 0.9 cm, unchanged. Additional slightly smaller nodules are also not significantly changed IMPRESSION: Overall stable appearance of multiple nodules.  Pt denies: - feeling nodules in neck - hoarseness - dysphagia - choking - SOB with lying down  Pt does not have a FH of thyroid ds. No FH of thyroid cancer. No h/o radiation tx to head or neck.  No seaweed or kelp. No recent contrast studies. + herbal supplements - kudzu, soy isoflavones, etc. + Biotin use in MVI  - 100 mcg (took these today). No recent steroids use.   Pt. also has a history of Hep B - Dr. Posey Pronto. On antiviral med.  She started OCPs x 2 mo for irreg. Menses.   ROS: Constitutional: no weight gain/no weight loss, no fatigue, no subjective hyperthermia, no subjective hypothermia Eyes: no blurry vision, no xerophthalmia ENT: no sore throat, + see HPI Cardiovascular: no CP/no SOB/no palpitations/no leg swelling Respiratory: no cough/no SOB/no wheezing Gastrointestinal: no N/no V/no D/no C/no acid reflux Musculoskeletal: no muscle aches/no joint aches Skin: no rashes, no hair loss Neurological: no tremors/no numbness/no tingling/no dizziness  I reviewed pt's medications, allergies, PMH, social hx, family hx, and changes were documented in the history of present illness. Otherwise, unchanged from my initial visit note.  Past Medical History:  Diagnosis Date  . Liver problem   . Thyroid disease  Social History   Social History  . Marital status: Married    Spouse name: N/A  . Number of children: 2   Occupational History  . Housekeeping   Social History Main Topics  . Smoking status: Never Smoker  . Smokeless tobacco: Never Used  . Alcohol use No  . Drug use: No   Current Outpatient Medications  Medication Sig Dispense Refill  . Tenofovir Alafenamide Fumarate 25 MG TABS Take by mouth.     No current facility-administered  medications for this visit.    Allergies  Allergen Reactions  . Other     TREE ALLERGY   No family history on file.  PE: BP 110/70   Pulse 82   Ht 5' 6.5" (1.689 m) Comment: measured  Wt 150 lb (68 kg)   LMP 06/30/2018   SpO2 98%   BMI 23.85 kg/m  Wt Readings from Last 3 Encounters:  07/10/18 150 lb (68 kg)  07/11/17 153 lb (69.4 kg)  12/10/16 141 lb (64 kg)   Constitutional: Normal weight, in NAD Eyes: PERRLA, EOMI, no exophthalmos ENT: moist mucous membranes, no thyromegaly, no cervical lymphadenopathy Cardiovascular: RRR, No MRG Respiratory: CTA B Gastrointestinal: abdomen soft, NT, ND, BS+ Musculoskeletal: no deformities, strength intact in all 4 Skin: moist, warm, no rashes Neurological: no tremor with outstretched hands, DTR normal in all 4  ASSESSMENT: 1. Subclinical Hyperthyroidism  2. Toxic L adenoma  3. Multinodular thyroid  PLAN:  1. And 2.  Patient with mild intermittent subclinical hyperthyroidism, without thyrotoxic symptoms, except for slight anxiety.  She has irregular menstrual cycles but she is likely perimenopausal.  At last visit, she had a 12 pound weight gain compared to the previous visit, however, at the previous visit she had an 8 pound weight loss. Lost 3 lbs since last OV. -We reviewed her most recent thyroid test results and explained that these are now normal.  -We also reviewed her uptake and scan that showed a left mildly toxic adenoma, however, with normal TFTs, no intervention is needed for now except for follow-up with 6 months or yearly TFTs.  I would not suggest RAI treatment for now. -We do not need to add beta-blockers at this time since she is not tachycardic or tremulous -we will repeat her TSH, free T4, free T3 in 4-5 weeks after stopping the herbal supplements - I advised her to not take her MVIs before labs that day -I will have her back in 1 year for another visit  3. Multinodular thyroid -No neck compression  symptoms -Reviewed together the latest ultrasound results from a year ago.  The nodules were small and not significantly changed from before.  I would not repeat the ultrasound for now but may repeat in 3 to 5 years.  Orders Placed This Encounter  Procedures  . TSH  . T4, free  . T3, free   Patient Instructions  Stop the Women's Choice vitamins.  Continue Multivitamins and come back for a recheck of the tests in 4-5 weeks. Do not take any supplements in the day of the lab.  Please come back for another visit 1 year.  - time spent with the patient: 25 min, of which >50% was spent in obtaining information about her symptoms, reviewing her previous labs, evaluations, and treatments, counseling her about her conditions (please see the discussed topics above), and developing a plan to further investigate and treat it; she had a number of questions which I addressed.  [name], MD PhD  [facility]

## 2018-07-10 NOTE — Patient Instructions (Signed)
Stop the Women's Choice vitamins.  Continue Multivitamins and come back for a recheck of the tests in 4-5 weeks. Do not take any supplements in the day of the lab.  Please come back for another visit 1 year.

## 2018-07-26 ENCOUNTER — Encounter: Payer: Self-pay | Admitting: Internal Medicine

## 2018-07-28 DIAGNOSIS — Z23 Encounter for immunization: Secondary | ICD-10-CM | POA: Diagnosis not present

## 2018-07-31 DIAGNOSIS — Z6824 Body mass index (BMI) 24.0-24.9, adult: Secondary | ICD-10-CM | POA: Diagnosis not present

## 2018-07-31 DIAGNOSIS — N938 Other specified abnormal uterine and vaginal bleeding: Secondary | ICD-10-CM | POA: Diagnosis not present

## 2018-07-31 DIAGNOSIS — Z01411 Encounter for gynecological examination (general) (routine) with abnormal findings: Secondary | ICD-10-CM | POA: Diagnosis not present

## 2018-08-07 ENCOUNTER — Encounter: Payer: Self-pay | Admitting: Internal Medicine

## 2018-08-08 ENCOUNTER — Other Ambulatory Visit: Payer: Self-pay | Admitting: Nurse Practitioner

## 2018-08-08 DIAGNOSIS — Z1231 Encounter for screening mammogram for malignant neoplasm of breast: Secondary | ICD-10-CM

## 2018-08-21 ENCOUNTER — Other Ambulatory Visit (INDEPENDENT_AMBULATORY_CARE_PROVIDER_SITE_OTHER): Payer: 59

## 2018-08-21 DIAGNOSIS — E0590 Thyrotoxicosis, unspecified without thyrotoxic crisis or storm: Secondary | ICD-10-CM | POA: Diagnosis not present

## 2018-08-21 DIAGNOSIS — E0510 Thyrotoxicosis with toxic single thyroid nodule without thyrotoxic crisis or storm: Secondary | ICD-10-CM | POA: Diagnosis not present

## 2018-08-22 LAB — TSH: TSH: 0.36 u[IU]/mL (ref 0.35–4.50)

## 2018-08-22 LAB — T3, FREE: T3, Free: 3.2 pg/mL (ref 2.3–4.2)

## 2018-08-22 LAB — T4, FREE: Free T4: 0.88 ng/dL (ref 0.60–1.60)

## 2018-09-06 ENCOUNTER — Ambulatory Visit: Payer: 59

## 2018-09-29 ENCOUNTER — Ambulatory Visit: Payer: 59

## 2018-12-04 ENCOUNTER — Ambulatory Visit: Payer: 59

## 2018-12-07 ENCOUNTER — Telehealth: Payer: Self-pay | Admitting: *Deleted

## 2018-12-07 NOTE — Telephone Encounter (Signed)
   I called the pt and LVM asking for a call back today if possible to confirm if she is coming into the office for her appt on Monday 6/8 @ 9:00. I advised of requirement of mask and covid19 screening questions including fever, cough, SOB, if she's been exposed to anyone positive for COVID19 or suspected of having it. I left the office number in message and advised if we do not hear back within 24 hours, we will cancel the appt and she will need to reschedule.      COVID 19 risk score 0.

## 2018-12-11 ENCOUNTER — Ambulatory Visit: Payer: 59 | Admitting: Neurology

## 2019-02-05 ENCOUNTER — Ambulatory Visit: Payer: 59

## 2019-03-19 ENCOUNTER — Ambulatory Visit: Payer: 59

## 2019-04-16 ENCOUNTER — Ambulatory Visit: Payer: 59

## 2019-06-04 ENCOUNTER — Ambulatory Visit
Admission: RE | Admit: 2019-06-04 | Discharge: 2019-06-04 | Disposition: A | Discharge status: Home / Self Care | Payer: 59 | Source: Ambulatory Visit | Attending: Nurse Practitioner | Admitting: Nurse Practitioner

## 2019-06-04 ENCOUNTER — Other Ambulatory Visit: Payer: Self-pay

## 2019-06-04 DIAGNOSIS — Z1231 Encounter for screening mammogram for malignant neoplasm of breast: Secondary | ICD-10-CM

## 2019-07-16 ENCOUNTER — Other Ambulatory Visit: Payer: Self-pay

## 2019-07-16 ENCOUNTER — Encounter: Payer: Self-pay | Admitting: Internal Medicine

## 2019-07-16 ENCOUNTER — Ambulatory Visit (INDEPENDENT_AMBULATORY_CARE_PROVIDER_SITE_OTHER): Payer: 59 | Admitting: Internal Medicine

## 2019-07-16 DIAGNOSIS — E0510 Thyrotoxicosis with toxic single thyroid nodule without thyrotoxic crisis or storm: Secondary | ICD-10-CM

## 2019-07-16 DIAGNOSIS — E042 Nontoxic multinodular goiter: Secondary | ICD-10-CM | POA: Diagnosis not present

## 2019-07-16 DIAGNOSIS — E0590 Thyrotoxicosis, unspecified without thyrotoxic crisis or storm: Secondary | ICD-10-CM | POA: Diagnosis not present

## 2019-07-16 NOTE — Patient Instructions (Signed)
Please come to the lab for labs at your convenience.  Please come back for another visit 1 year.

## 2019-07-16 NOTE — Progress Notes (Signed)
Patient ID: Lacey White, female   DOB: 1970-10-10, 49 y.o.   MRN: [id number]   Patient location: Home My location: Office Persons participating in the virtual visit: patient, provider  Referring Provider: Glenford Bayley, DO  I connected with the patient on 07/16/19 at 10:04 AM EST by a video enabled telemedicine application and verified that I am speaking with the correct person.   I discussed the limitations of evaluation and management by telemedicine and the availability of in person appointments. The patient expressed understanding and agreed to proceed.   Details of the encounter are shown below.  HPI  Lacey White is a 49 y.o.-year-old female, presenting for follow-up for subclinical thyrotoxicosis, toxic L adenoma, and multinodular thyroid.  Last visit 1 year ago.  Reviewed patient's TFTs: They have been normal in the last 3 years: Lab Results  Component Value Date   TSH 0.36 08/21/2018   TSH 0.45 07/11/2017   TSH 0.61 12/10/2016   TSH 0.41 06/11/2016   Lab Results  Component Value Date   FREET4 0.88 08/21/2018   FREET4 0.76 07/11/2017   FREET4 0.96 12/10/2016   FREET4 0.94 06/11/2016   Lab Results  Component Value Date   T3FREE 3.2 08/21/2018   T3FREE 3.0 07/11/2017   T3FREE 3.1 12/10/2016   T3FREE 3.0 06/11/2016   Prev: 03/18/2016: TSH 0.153 (0.2-4.5), free T4 1.1, free T3 3.57 03/18/2015: TSH 0.54 , free T4 1.39, free T3 3.07  08/23/2014: TSH 1.29, free T4 1.34, free T3 2.94 05/08/2014: TSH 0.75, free T4 1.25, free T3 2.87 02/20/2014: TSH 0.29, free T4 1.16, free T3 2.88 05/02/2013: TSH 0.86 (0.45-4.5), free T4 1.41, free T3 2.73  Thyroid Uptake and scan (03/26/2014): normal uptake, 20.9%; left hot nodule  Thyroid U/S (09/03/2013):  Right thyroid lobe: 66 x 14 x 21 mm. Innumerable hypoechoic/ cystic nodules.  7 x 5.5 x 8.8 mm superior pole - largest 9 x 7 x 11 mm mid lobe 6 x 8 x 8 mm inferior pole Left thyroid lobe: 67 x 14 x 24 mm. Multiple  hypoechoic/cystic nodules. 10 x 5 x 10 mm complex, mid lobe - largest 9 x 9 x 9 mm solid, inferior pole Isthmus Thickness: 2.3 mm.  No nodules visualized. Lymphadenopathy: None visualized.  Thyroid U/S - [facility] (07/18/2017): Right thyroid lobe: 4.8 x 1.2 x 2.3 cm.   Mildly heterogeneous parenchyma.  Normal background vascularity. Index hypoechoic nodule in the upper pole measuring 1 x 0.6 x 0.8 cm, unchanged. 2 additional similar sized hypoechoic nodules in the right lobe midpole region are also not significantly changed. Isthmus: 2 mm Left thyroid lobe: 5 x 1 x 2.3 cm. Mildly heterogeneous parenchyma with normal vascularity. Index nearly isoechoic lower pole nodule measuring 1.3 x 1.0 x 0.9 cm, unchanged. Additional slightly smaller nodules are also not significantly changed IMPRESSION: Overall stable appearance of multiple nodules.  Pt denies: - feeling nodules in neck - hoarseness - dysphagia - choking - SOB with lying down  No family history of thyroid disease or thyroid cancer.  No previous radiation therapy to head or neck.  No seaweed or kelp.  No recent contrast studies.  She does take herbal supplements- kudzu, soy isoflavones, etc. Her multivitamins have 100 mcg of biotin.  No recent steroid use.   Pt. also has a history of Hep B - Dr. Posey Pronto. On antiviral med. (Tenofovir).  She was on OCPs for irregular menses >> now off >> has hot flushes.  ROS: Constitutional: no weight gain/no weight  loss, no fatigue, + hot flushes - insomnia, no subjective hypothermia Eyes: no blurry vision, no xerophthalmia ENT: no sore throat, + see HPI Cardiovascular: no CP/no SOB/no palpitations/no leg swelling Respiratory: no cough/no SOB/no wheezing Gastrointestinal: no N/no V/no D/no C/no acid reflux Musculoskeletal: no muscle aches/no joint aches Skin: no rashes, no hair loss Neurological: no tremors/no numbness/no tingling/no dizziness  I reviewed pt's medications, allergies, PMH,  social hx, family hx, and changes were documented in the history of present illness. Otherwise, unchanged from my initial visit note.  Past Medical History:  Diagnosis Date  . Liver problem   . Thyroid disease    Social History   Social History  . Marital status: Married    Spouse name: N/A  . Number of children: 2   Occupational History  . Housekeeping   Social History Main Topics  . Smoking status: Never Smoker  . Smokeless tobacco: Never Used  . Alcohol use No  . Drug use: No   Current Outpatient Medications  Medication Sig Dispense Refill  . Norethin Ace-Eth Estrad-FE (TAYTULLA) 1-20 MG-MCG(24) CAPS Taytulla 1 mg-20 mcg (24)/75 mg (4) capsule  TAKE 1 CAPSULE(S) EVERY DAY BY ORAL ROUTE    . Tenofovir Alafenamide Fumarate 25 MG TABS Take by mouth.     No current facility-administered medications for this visit.   Allergies  Allergen Reactions  . Other     TREE ALLERGY   No family history on file.  PE: There were no vitals taken for this visit. Wt Readings from Last 3 Encounters:  07/10/18 150 lb (68 kg)  07/11/17 153 lb (69.4 kg)  12/10/16 141 lb (64 kg)   Constitutional:  in NAD  The physical exam was not performed (virtual visit).  ASSESSMENT: 1.  History of subclinical Hyperthyroidism  2. Toxic L adenoma  3. Multinodular thyroid  PLAN:  1. And 2.  Patient with mild intermittent subclinical hyperthyroidism, now euthyroid for the last 3 years.  She did not have thyrotoxic symptoms except for slight anxiety.  At this visit, she denies new thyrotoxic symptoms: No tremors, palpitations, weight loss, increased anxiety.  She has heat intolerance and hot flashes at night which keeps him from sleeping.  Of note, she was on OCPs in the past for irregular menstrual cycles but stopped them after our last visit.  Her hot flashes are most likely related to perimenopause. She had some weight gain in the past but at last visit she was able to lose the majority of the  extra weight -We reviewed her most recent thyroid test results from 08/2018 and these were normal -Her thyroid uptake and scan showed a left mildly toxic adenoma, however, with normal TFTs, no intervention is needed except checking her TFTs -We will repeat her TSH, free T4, free T3 when she returns to the clinic (now coronavirus pandemic) -I advised her to be off biotin for at least 2 days before checking her labs -I will see her back in another year  3. Multinodular thyroid -She denies neck compression symptoms -Latest thyroid ultrasound was from 2019.  The nodules were small and not significantly changed from before. -Plan to repeat another ultrasound in 3 to 5 years from the previous  Orders Placed This Encounter  Procedures  . xtpit - TSH  . xtpit - free T4  . xtpit - free T3    [name], MD PhD [facility]

## 2019-07-17 ENCOUNTER — Other Ambulatory Visit: Payer: 59

## 2019-08-27 ENCOUNTER — Telehealth: Payer: Self-pay | Admitting: Internal Medicine

## 2019-08-27 ENCOUNTER — Other Ambulatory Visit: Payer: 59

## 2019-08-27 DIAGNOSIS — Z833 Family history of diabetes mellitus: Secondary | ICD-10-CM

## 2019-08-27 NOTE — Telephone Encounter (Signed)
Patient requests to have labs added to her lab appointment on 09/03/19 to check her blood sugar levels due to patient's mother has diabetes. Patient requests to be called at ph# [phone] to let her know that the above has been done or to let patient know if Dr. Cruzita Lederer does not want to add the above lab request.

## 2019-08-27 NOTE — Telephone Encounter (Signed)
OK to add a HbA1c  - dx: FH of diabetes

## 2019-09-03 ENCOUNTER — Other Ambulatory Visit: Payer: Self-pay

## 2019-09-03 ENCOUNTER — Other Ambulatory Visit: Payer: 59

## 2019-09-03 ENCOUNTER — Other Ambulatory Visit: Payer: Self-pay | Admitting: Internal Medicine

## 2019-09-03 DIAGNOSIS — E0590 Thyrotoxicosis, unspecified without thyrotoxic crisis or storm: Secondary | ICD-10-CM

## 2019-09-03 DIAGNOSIS — Z833 Family history of diabetes mellitus: Secondary | ICD-10-CM

## 2019-09-03 DIAGNOSIS — E0510 Thyrotoxicosis with toxic single thyroid nodule without thyrotoxic crisis or storm: Secondary | ICD-10-CM

## 2019-09-03 LAB — T3, FREE: T3, Free: 3.4 pg/mL (ref 2.3–4.2)

## 2019-09-03 LAB — T4, FREE: Free T4: 0.9 ng/dL (ref 0.60–1.60)

## 2019-09-03 LAB — HEMOGLOBIN A1C: Hgb A1c MFr Bld: 5.4 % (ref 4.6–6.5)

## 2019-09-03 LAB — TSH: TSH: 0.28 u[IU]/mL — ABNORMAL LOW (ref 0.35–4.50)

## 2019-09-07 ENCOUNTER — Telehealth: Payer: Self-pay | Admitting: Internal Medicine

## 2019-09-07 NOTE — Telephone Encounter (Signed)
Patient called stating that she got a MyChart message from Dr Cruzita Lederer saying her thyroid levels were low and patient needs to check back in 2 months - I do not see the original message and I was confirming wether or not we needed to make a follow up for 2 months. Patient ph# [phone].

## 2019-09-07 NOTE — Telephone Encounter (Signed)
xtpit - TSH: Comments to Patient   Dear Ms. Lacey White, Your HbA1c is excellent. Your thyroid tests are normal with the exception of the TSH, which is slightly low. No intervention is needed for now, but I would suggest to repeat the thyroid tests in 2 months. Sincerely, [name] MD  [name], MD on 09/03/2019 5:11 PM EST [name] on 09/03/2019 6:56 PM EST

## 2019-11-12 ENCOUNTER — Other Ambulatory Visit: Payer: 59

## 2019-11-19 ENCOUNTER — Other Ambulatory Visit: Payer: Self-pay

## 2019-11-19 ENCOUNTER — Other Ambulatory Visit (INDEPENDENT_AMBULATORY_CARE_PROVIDER_SITE_OTHER): Payer: 59

## 2019-11-19 DIAGNOSIS — E0510 Thyrotoxicosis with toxic single thyroid nodule without thyrotoxic crisis or storm: Secondary | ICD-10-CM | POA: Diagnosis not present

## 2019-11-20 ENCOUNTER — Other Ambulatory Visit: Payer: Self-pay | Admitting: Internal Medicine

## 2019-11-20 DIAGNOSIS — E0510 Thyrotoxicosis with toxic single thyroid nodule without thyrotoxic crisis or storm: Secondary | ICD-10-CM

## 2019-11-20 LAB — T3, FREE: T3, Free: 3.1 pg/mL (ref 2.3–4.2)

## 2019-11-20 LAB — T4, FREE: Free T4: 0.96 ng/dL (ref 0.60–1.60)

## 2019-11-20 LAB — TSH: TSH: 0.23 u[IU]/mL — ABNORMAL LOW (ref 0.35–4.50)

## 2019-11-21 NOTE — Telephone Encounter (Signed)
Patient called stating her TSH lab done a few days ago are still low and she is wondering what she needs to do at this point. Please advise. [phone]

## 2019-11-21 NOTE — Telephone Encounter (Signed)
Dear Ms. Lacey White, Your thyroid tests are stable, with still a slightly low TSH and normal free thyroid test. At this point, we can repeat the tests in 3 to 4 months, but no intervention is absolutely needed. Please call our main office number [phone]) to schedule a lab appointment.  Sincerely, [name] MD  [name], MD on 11/20/2019 5:01 PM EDT [name] on 11/20/2019 7:26 PM EDT  Patient just needs a lab appt in the next 3-4 months, can you please schedule.

## 2019-11-30 ENCOUNTER — Encounter: Payer: Self-pay | Admitting: Internal Medicine

## 2020-03-03 ENCOUNTER — Other Ambulatory Visit: Payer: Self-pay

## 2020-03-03 ENCOUNTER — Other Ambulatory Visit: Payer: 59

## 2020-03-03 ENCOUNTER — Other Ambulatory Visit (INDEPENDENT_AMBULATORY_CARE_PROVIDER_SITE_OTHER): Payer: 59

## 2020-03-03 DIAGNOSIS — E0510 Thyrotoxicosis with toxic single thyroid nodule without thyrotoxic crisis or storm: Secondary | ICD-10-CM | POA: Diagnosis not present

## 2020-03-03 LAB — TSH: TSH: 0.34 u[IU]/mL — ABNORMAL LOW (ref 0.35–4.50)

## 2020-03-03 LAB — T3, FREE: T3, Free: 3.8 pg/mL (ref 2.3–4.2)

## 2020-03-03 LAB — T4, FREE: Free T4: 0.87 ng/dL (ref 0.60–1.60)

## 2020-05-12 ENCOUNTER — Other Ambulatory Visit: Payer: Self-pay | Admitting: Nurse Practitioner

## 2020-05-12 DIAGNOSIS — Z1231 Encounter for screening mammogram for malignant neoplasm of breast: Secondary | ICD-10-CM

## 2020-06-23 ENCOUNTER — Ambulatory Visit: Payer: 59

## 2020-07-14 ENCOUNTER — Ambulatory Visit: Payer: 59 | Admitting: Internal Medicine

## 2020-07-16 ENCOUNTER — Other Ambulatory Visit: Payer: Self-pay

## 2020-07-18 ENCOUNTER — Ambulatory Visit: Payer: 59 | Admitting: Internal Medicine

## 2020-07-18 ENCOUNTER — Other Ambulatory Visit: Payer: Self-pay

## 2020-07-18 ENCOUNTER — Encounter: Payer: Self-pay | Admitting: Internal Medicine

## 2020-07-18 VITALS — BP 112/80 | HR 88 | Ht 67.0 in | Wt 153.0 lb

## 2020-07-18 DIAGNOSIS — E042 Nontoxic multinodular goiter: Secondary | ICD-10-CM | POA: Diagnosis not present

## 2020-07-18 DIAGNOSIS — E0510 Thyrotoxicosis with toxic single thyroid nodule without thyrotoxic crisis or storm: Secondary | ICD-10-CM | POA: Diagnosis not present

## 2020-07-18 LAB — T4, FREE: Free T4: 0.77 ng/dL (ref 0.60–1.60)

## 2020-07-18 LAB — TSH: TSH: 0.25 u[IU]/mL — ABNORMAL LOW (ref 0.35–4.50)

## 2020-07-18 LAB — T3, FREE: T3, Free: 3.3 pg/mL (ref 2.3–4.2)

## 2020-07-18 NOTE — Progress Notes (Signed)
Patient ID: Lacey White, female   DOB: [birth year], 50 y.o.   MRN: [id number]   This visit occurred during the SARS-CoV-2 public health emergency.  Safety protocols were in place, including screening questions prior to the visit, additional usage of staff PPE, and extensive cleaning of exam room while observing appropriate contact time as indicated for disinfecting solutions.   HPI  Lacey White is a 50 y.o.-year-old female, presenting for follow-up for subclinical thyrotoxicosis, toxic L adenoma, and multinodular thyroid.  Last visit 1 year ago.  Reviewed TFTs -TSH slightly low in the last year: Lab Results  Component Value Date   TSH 0.34 (L) 03/03/2020   TSH 0.23 (L) 11/19/2019   TSH 0.28 (L) 09/03/2019   TSH 0.36 08/21/2018   TSH 0.45 07/11/2017   TSH 0.61 12/10/2016   TSH 0.41 06/11/2016   Lab Results  Component Value Date   FREET4 0.87 03/03/2020   FREET4 0.96 11/19/2019   FREET4 0.90 09/03/2019   FREET4 0.88 08/21/2018   FREET4 0.76 07/11/2017   FREET4 0.96 12/10/2016   FREET4 0.94 06/11/2016   Lab Results  Component Value Date   T3FREE 3.8 03/03/2020   T3FREE 3.1 11/19/2019   T3FREE 3.4 09/03/2019   T3FREE 3.2 08/21/2018   T3FREE 3.0 07/11/2017   T3FREE 3.1 12/10/2016   T3FREE 3.0 06/11/2016   Prev: 03/18/2016: TSH 0.153 (0.2-4.5), free T4 1.1, free T3 3.57 03/18/2015: TSH 0.54 , free T4 1.39, free T3 3.07  08/23/2014: TSH 1.29, free T4 1.34, free T3 2.94 05/08/2014: TSH 0.75, free T4 1.25, free T3 2.87 02/20/2014: TSH 0.29, free T4 1.16, free T3 2.88 05/02/2013: TSH 0.86 (0.45-4.5), free T4 1.41, free T3 2.73  Thyroid Uptake and scan (03/26/2014): normal uptake, 20.9%; left hot nodule  Thyroid U/S (09/03/2013):  Right thyroid lobe: 66 x 14 x 21 mm. Innumerable hypoechoic/ cystic nodules.  7 x 5.5 x 8.8 mm superior pole - largest 9 x 7 x 11 mm mid lobe 6 x 8 x 8 mm inferior pole Left thyroid lobe: 67 x 14 x 24 mm. Multiple hypoechoic/cystic nodules. 10 x  5 x 10 mm complex, mid lobe - largest 9 x 9 x 9 mm solid, inferior pole Isthmus Thickness: 2.3 mm.  No nodules visualized. Lymphadenopathy: None visualized.  Thyroid U/S - [facility] (07/18/2017): Right thyroid lobe: 4.8 x 1.2 x 2.3 cm.   Mildly heterogeneous parenchyma.  Normal background vascularity. Index hypoechoic nodule in the upper pole measuring 1 x 0.6 x 0.8 cm, unchanged. 2 additional similar sized hypoechoic nodules in the right lobe midpole region are also not significantly changed. Isthmus: 2 mm Left thyroid lobe: 5 x 1 x 2.3 cm. Mildly heterogeneous parenchyma with normal vascularity. Index nearly isoechoic lower pole nodule measuring 1.3 x 1.0 x 0.9 cm, unchanged. Additional slightly smaller nodules are also not significantly changed IMPRESSION: Overall stable appearance of multiple nodules.  Pt denies: - feeling nodules in neck - hoarseness - dysphagia - choking - SOB with lying down  No FH of thyroid cancer. No h/o radiation tx to head or neck.  No seaweed or kelp. No recent contrast studies. + herbal supplements- kudzu, soy isoflavones, etc. . No Biotin use. No recent steroids use.   Pt. also has a history of Hep B - Dr. Posey Pronto. On antiviral med. (Tenofovir).  She was on OCPs for irregular menses >> now off >> has hot flashes - improved. Now amenorrheic x5 months.   ROS: Constitutional: no weight gain/no weight loss, no  fatigue, + subjective hyperthermia, no subjective hypothermia Eyes: no blurry vision, no xerophthalmia ENT: no sore throat, no nodules palpated in neck, no dysphagia, no odynophagia, no hoarseness Cardiovascular: no CP/no SOB/no palpitations/no leg swelling Respiratory: no cough/no SOB/no wheezing Gastrointestinal: no N/no V/no D/no C/no acid reflux Musculoskeletal: no muscle aches/no joint aches Skin: no rashes, + hair loss Neurological: no tremors/no numbness/no tingling/no dizziness  I reviewed pt's medications, allergies, PMH, social hx,  family hx, and changes were documented in the history of present illness. Otherwise, unchanged from my initial visit note.  Past Medical History:  Diagnosis Date  . Liver problem   . Thyroid disease    Social History   Social History  . Marital status: Married    Spouse name: N/A  . Number of children: 2   Occupational History  . Housekeeping   Social History Main Topics  . Smoking status: Never Smoker  . Smokeless tobacco: Never Used  . Alcohol use No  . Drug use: No   Current Outpatient Medications  Medication Sig Dispense Refill  . Tenofovir Alafenamide Fumarate 25 MG TABS Take by mouth.     No current facility-administered medications for this visit.   Allergies  Allergen Reactions  . Other     TREE ALLERGY   No family history on file.  PE: BP 112/80 (BP Location: Right Arm, Patient Position: Sitting, Cuff Size: Normal)   Pulse 88   Ht 5\' 7"  (1.702 m)   Wt 153 lb (69.4 kg)   SpO2 98%   BMI 23.96 kg/m  Wt Readings from Last 3 Encounters:  07/18/20 153 lb (69.4 kg)  07/10/18 150 lb (68 kg)  07/11/17 153 lb (69.4 kg)   Constitutional: normal weight, in NAD Eyes: PERRLA, EOMI, no exophthalmos ENT: moist mucous membranes, no thyromegaly, no cervical lymphadenopathy Cardiovascular: RRR, No MRG Respiratory: CTA B Gastrointestinal: abdomen soft, NT, ND, BS+ Musculoskeletal: no deformities, strength intact in all 4 Skin: moist, warm, no rashes Neurological: no tremor with outstretched hands, DTR normal in all 4  ASSESSMENT: 1.  History of subclinical Hyperthyroidism  2. Toxic L adenoma  3. Multinodular thyroid  PLAN:  1. And 2.  Patient with mild intermittent subclinical hypothyroidism.  She did not have thyrotoxic symptoms except for slight anxiety.  She denies tremors, palpitations, weight loss, increased anxiety.  She continues to have bothersome hot flashes at night and has trouble sleeping.  The hot flashes are most likely related to menopause. They  have improved in the last year.  She had some weight gain in the past but she was able to lose it. Wt stable now. -We reviewed her thyroid function test for the last year and the TSH was mildly low with normal free thyroid hormones -Her thyroid uptake and scan showed a left mildly toxic adenoma, however, her TSH was only slightly low with normal free T4 and free T3, so we decided to follow her expectantly -We will repeat her TFTs today -I will see her back in another year  3. Multinodular thyroid -She denies neck compression symptoms -Reviewed the latest thyroid ultrasound from 2019: The nodules were small and they did not appear significantly changed compared to before -Plan to repeat another ultrasound next year  Orders Placed This Encounter  Procedures  . TSH  . T4, free  . T3, free   Component     Latest Ref Rng & Units 07/18/2020  T4,Free(Direct)     0.60 - 1.60 ng/dL 0.77  Triiodothyronine,Free,Serum  2.3 - 4.2 pg/mL 3.3  TSH     0.35 - 4.50 uIU/mL 0.25 (L)   TSH slightly lower, with normal free thyroid hormones >> we can recheck the tests in 6 months, no intervention needed for now.  [name], MD PhD [facility]

## 2020-07-18 NOTE — Patient Instructions (Signed)
Please stop at the lab.  Please come back for a follow-up appointment in 1 year.  

## 2020-07-21 ENCOUNTER — Encounter: Payer: Self-pay | Admitting: Internal Medicine

## 2020-08-04 ENCOUNTER — Ambulatory Visit
Admission: RE | Admit: 2020-08-04 | Discharge: 2020-08-04 | Disposition: A | Discharge status: Home / Self Care | Payer: 59 | Source: Ambulatory Visit | Attending: Nurse Practitioner | Admitting: Nurse Practitioner

## 2020-08-04 ENCOUNTER — Other Ambulatory Visit: Payer: Self-pay

## 2020-08-04 DIAGNOSIS — Z1231 Encounter for screening mammogram for malignant neoplasm of breast: Secondary | ICD-10-CM

## 2020-11-17 ENCOUNTER — Telehealth: Payer: Self-pay | Admitting: Internal Medicine

## 2020-11-17 NOTE — Telephone Encounter (Signed)
Lacey White, she only had a mildly low TSH with normal free thyroid hormones in 07/2020. We can have her back for a recheck if she has new symptoms, if not, we can wait 3 more months to recheck. Labs are in.

## 2020-11-17 NOTE — Telephone Encounter (Signed)
Pt states that her level is low and is wondering is she needs to have more blood work done

## 2020-11-17 NOTE — Telephone Encounter (Signed)
Please advise 

## 2020-11-18 NOTE — Telephone Encounter (Signed)
Message left for patient to return my call.  

## 2020-11-18 NOTE — Telephone Encounter (Signed)
Spoken to patient and notified Dr Arman Filter comments. Verbalized understanding. Patient will call [facility] in [place] to schedule a lab appointment.

## 2020-11-21 ENCOUNTER — Other Ambulatory Visit: Payer: Self-pay

## 2020-11-21 ENCOUNTER — Other Ambulatory Visit (INDEPENDENT_AMBULATORY_CARE_PROVIDER_SITE_OTHER): Payer: 59

## 2020-11-21 DIAGNOSIS — E0510 Thyrotoxicosis with toxic single thyroid nodule without thyrotoxic crisis or storm: Secondary | ICD-10-CM | POA: Diagnosis not present

## 2020-11-21 LAB — T4, FREE: Free T4: 0.89 ng/dL (ref 0.60–1.60)

## 2020-11-21 LAB — T3, FREE: T3, Free: 2.9 pg/mL (ref 2.3–4.2)

## 2020-11-21 LAB — TSH: TSH: 0.26 u[IU]/mL — ABNORMAL LOW (ref 0.35–4.50)

## 2020-12-22 IMAGING — MG DIGITAL SCREENING BILAT W/ TOMO W/ CAD
8 series · 8 of 24 positions shown · non-contrast
Comparison: Previous exam(s).

CLINICAL DATA: Screening.

EXAM:
DIGITAL SCREENING BILATERAL MAMMOGRAM WITH TOMO AND CAD

[L CC synth-2D]
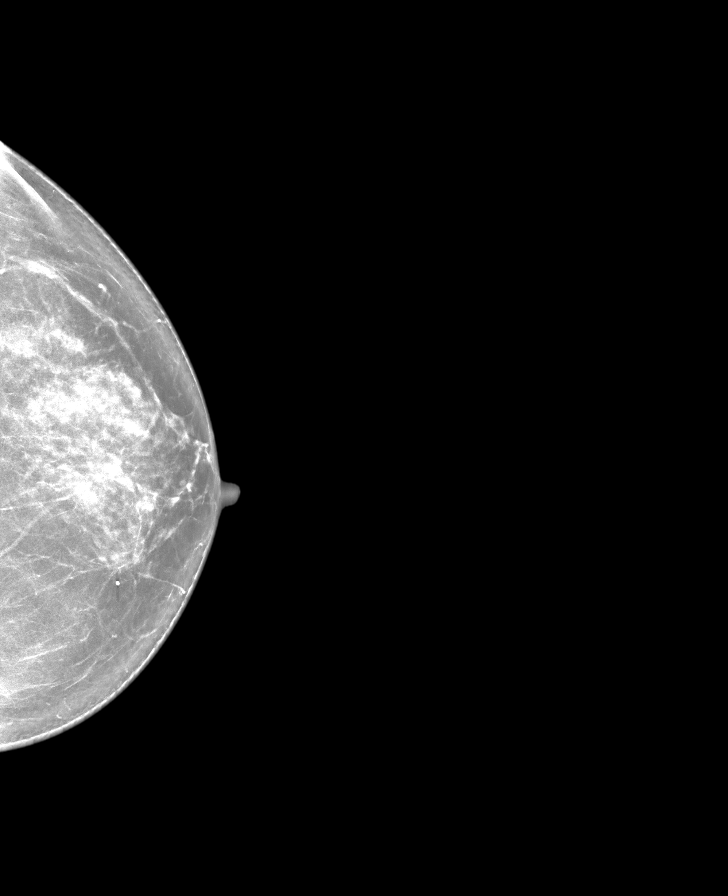

[R CC synth-2D]
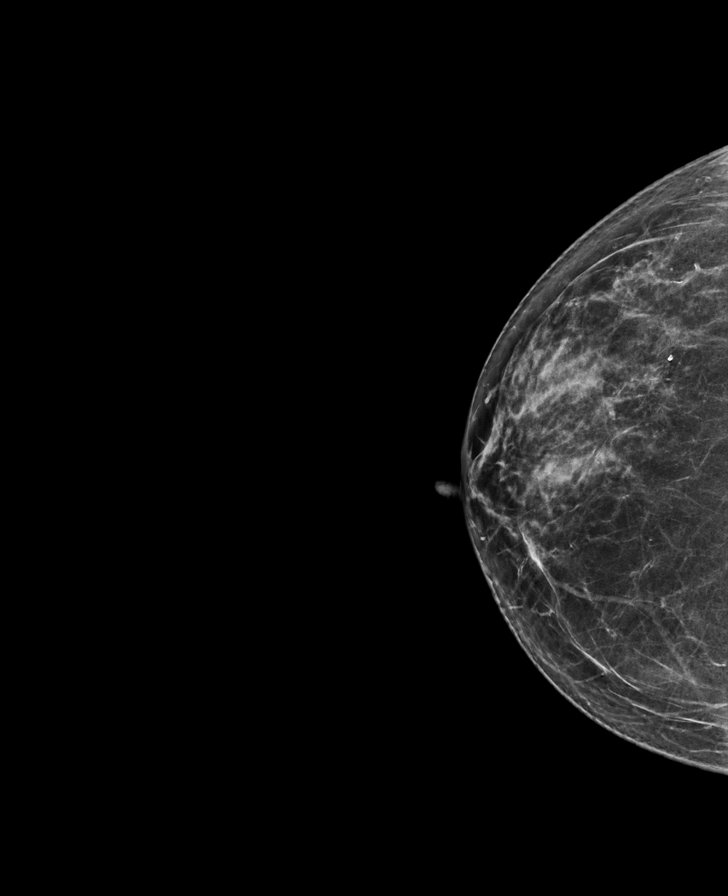

[R MLO synth-2D]
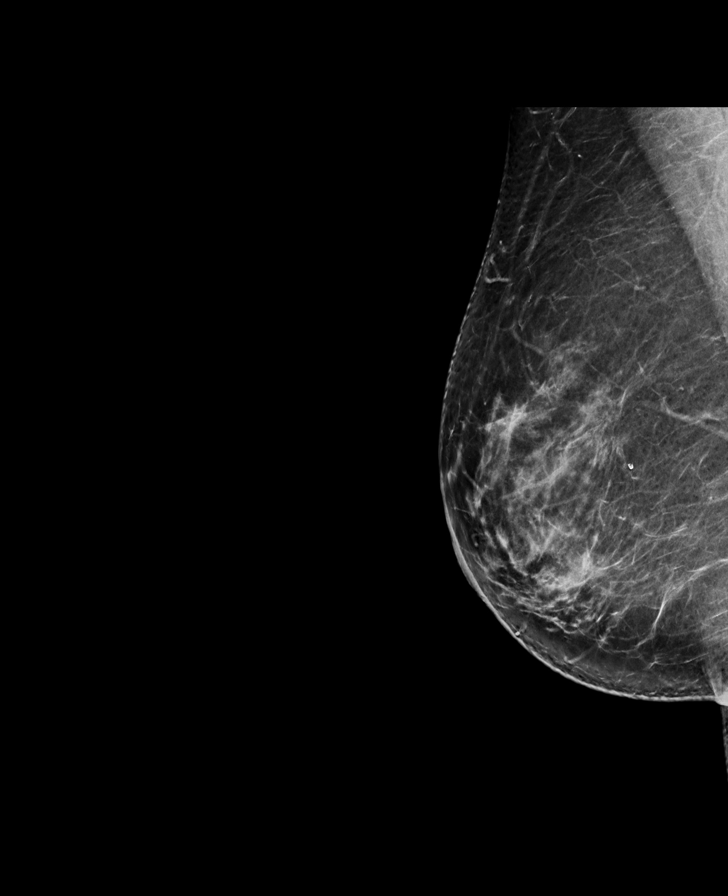

[L MLO synth-2D]
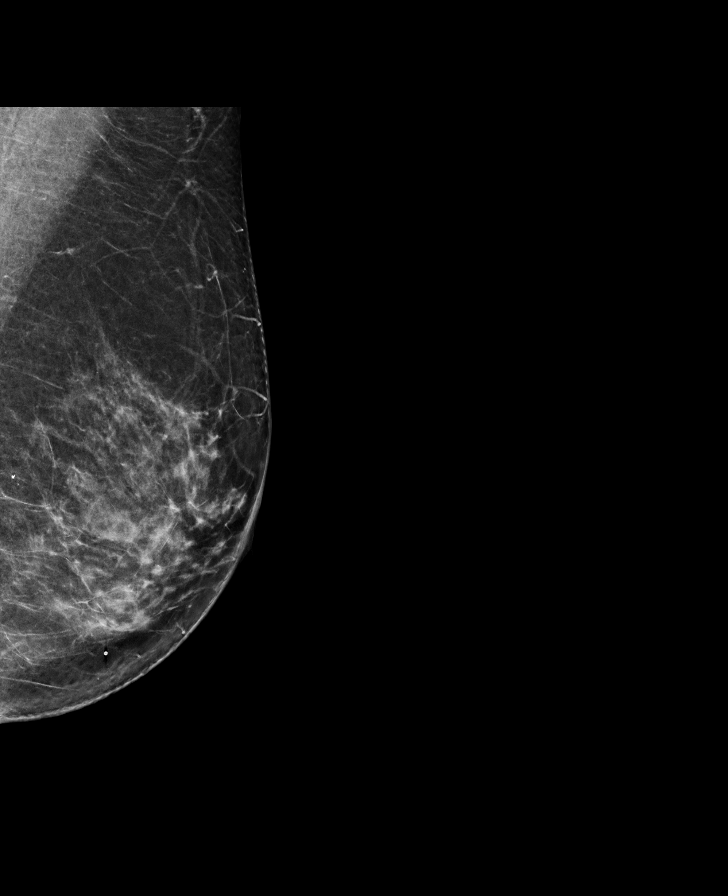

[L MLO tomo · tomo slice 37/74.0]
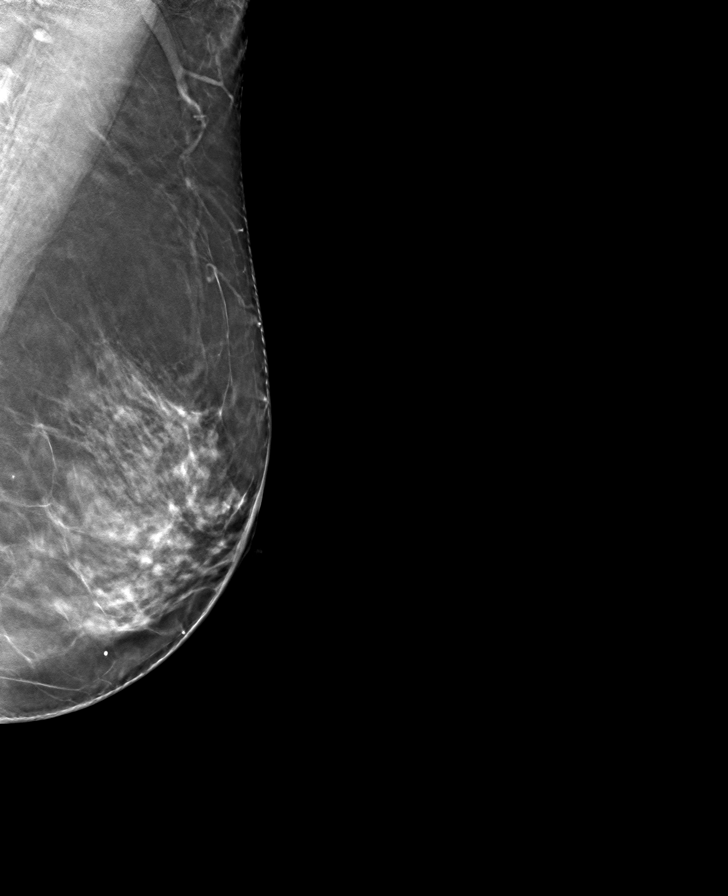

[R MLO tomo · tomo slice 41/82.0]
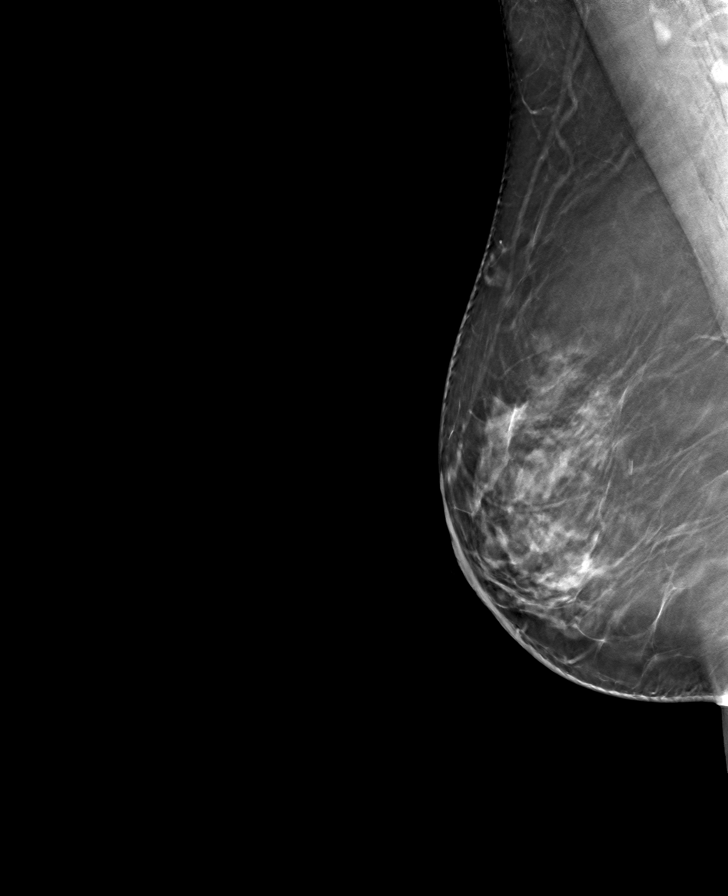

[R CC tomo · tomo slice 35/70.0]
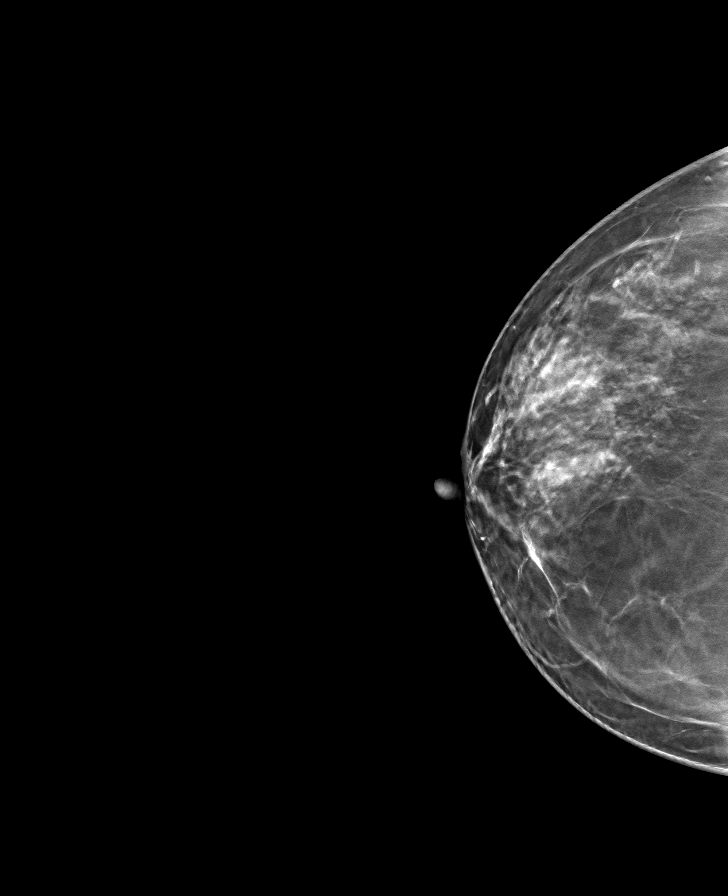

[L CC tomo · tomo slice 39/77.0]
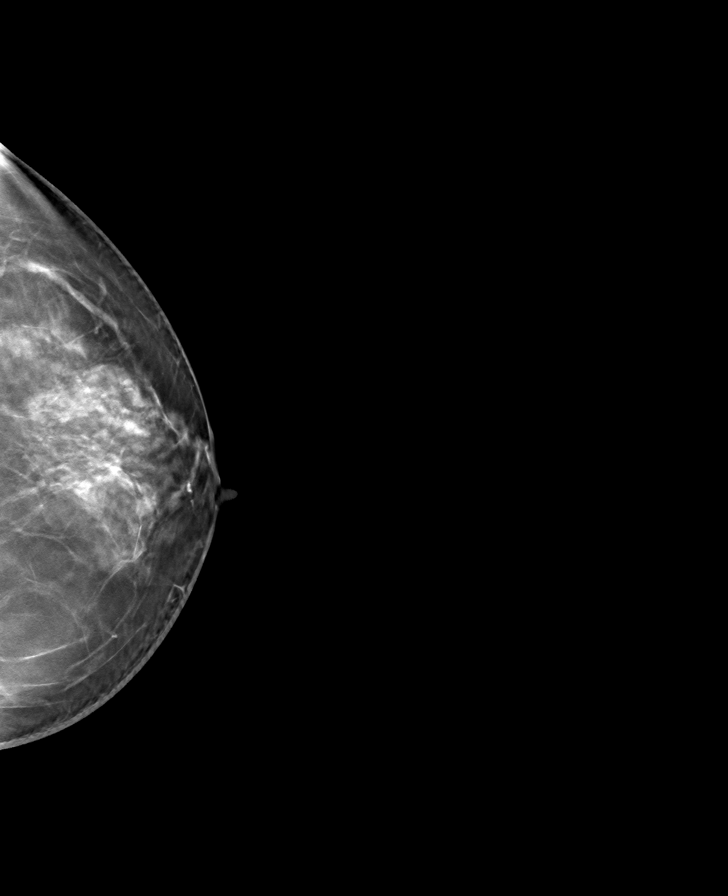

[8 of 24 positions shown; findings below may reference images not displayed]

ACR Breast Density Category c: The breast tissue is heterogeneously
dense, which may obscure small masses.
FINDINGS: There are no findings suspicious for malignancy. Images were
processed with CAD.
IMPRESSION: No mammographic evidence of malignancy. A result letter of this
screening mammogram will be mailed directly to the patient.

RECOMMENDATION:
Screening mammogram in one year. (Code:FT-U-LHB)

BI-RADS CATEGORY  1: Negative.

## 2021-02-24 ENCOUNTER — Other Ambulatory Visit: Payer: Self-pay | Admitting: Internal Medicine

## 2021-02-24 DIAGNOSIS — E0510 Thyrotoxicosis with toxic single thyroid nodule without thyrotoxic crisis or storm: Secondary | ICD-10-CM

## 2021-02-25 ENCOUNTER — Other Ambulatory Visit: Payer: Self-pay

## 2021-02-25 ENCOUNTER — Other Ambulatory Visit (INDEPENDENT_AMBULATORY_CARE_PROVIDER_SITE_OTHER): Payer: 59

## 2021-02-25 DIAGNOSIS — E0510 Thyrotoxicosis with toxic single thyroid nodule without thyrotoxic crisis or storm: Secondary | ICD-10-CM | POA: Diagnosis not present

## 2021-02-25 LAB — T4, FREE: Free T4: 0.8 ng/dL (ref 0.60–1.60)

## 2021-02-25 LAB — T3, FREE: T3, Free: 3.3 pg/mL (ref 2.3–4.2)

## 2021-02-25 LAB — TSH: TSH: 0.11 u[IU]/mL — ABNORMAL LOW (ref 0.35–5.50)

## 2021-02-27 ENCOUNTER — Encounter: Payer: Self-pay | Admitting: Internal Medicine

## 2021-03-02 ENCOUNTER — Other Ambulatory Visit: Payer: Self-pay | Admitting: Internal Medicine

## 2021-03-02 DIAGNOSIS — E0510 Thyrotoxicosis with toxic single thyroid nodule without thyrotoxic crisis or storm: Secondary | ICD-10-CM

## 2021-06-12 ENCOUNTER — Other Ambulatory Visit: Payer: 59

## 2021-07-20 ENCOUNTER — Ambulatory Visit: Payer: 59 | Admitting: Internal Medicine

## 2021-08-15 ENCOUNTER — Encounter: Payer: Self-pay | Admitting: Internal Medicine

## 2021-08-17 ENCOUNTER — Other Ambulatory Visit (INDEPENDENT_AMBULATORY_CARE_PROVIDER_SITE_OTHER): Payer: Managed Care, Other (non HMO)

## 2021-08-17 ENCOUNTER — Ambulatory Visit: Payer: Managed Care, Other (non HMO) | Admitting: Internal Medicine

## 2021-08-17 ENCOUNTER — Other Ambulatory Visit: Payer: Self-pay

## 2021-08-17 ENCOUNTER — Encounter: Payer: Self-pay | Admitting: Internal Medicine

## 2021-08-17 VITALS — BP 110/82 | HR 74 | Ht 67.0 in | Wt 148.2 lb

## 2021-08-17 DIAGNOSIS — E0510 Thyrotoxicosis with toxic single thyroid nodule without thyrotoxic crisis or storm: Secondary | ICD-10-CM | POA: Diagnosis not present

## 2021-08-17 DIAGNOSIS — E042 Nontoxic multinodular goiter: Secondary | ICD-10-CM

## 2021-08-17 LAB — TSH: TSH: 0.6 u[IU]/mL (ref 0.35–5.50)

## 2021-08-17 LAB — T3, FREE: T3, Free: 3.5 pg/mL (ref 2.3–4.2)

## 2021-08-17 LAB — T4, FREE: Free T4: 0.82 ng/dL (ref 0.60–1.60)

## 2021-08-17 NOTE — Progress Notes (Addendum)
Patient ID: Lacey White, female   DOB: 1971-01-28, 51 y.o.   MRN: [id number]   This visit occurred during the SARS-CoV-2 public health emergency.  Safety protocols were in place, including screening questions prior to the visit, additional usage of staff PPE, and extensive cleaning of exam room while observing appropriate contact time as indicated for disinfecting solutions.   HPI  Sonam Wandel is a 51 y.o.-year-old female, presenting for follow-up for subclinical thyrotoxicosis, toxic L adenoma, and multinodular thyroid.  Last visit 1 year ago.  Interim history: She started a new job 2 weeks ago - she is a little stressed. Pt denies: - weight loss - heat intolerance - tremors - palpitations - anxiety - hyperdefecation - hair loss  Reviewed TFTs -TSH slightly low in the last year: Lab Results  Component Value Date   TSH 0.11 (L) 02/25/2021   TSH 0.26 (L) 11/21/2020   TSH 0.25 (L) 07/18/2020   TSH 0.34 (L) 03/03/2020   TSH 0.23 (L) 11/19/2019   TSH 0.28 (L) 09/03/2019   TSH 0.36 08/21/2018   TSH 0.45 07/11/2017   TSH 0.61 12/10/2016   TSH 0.41 06/11/2016   Lab Results  Component Value Date   FREET4 0.80 02/25/2021   FREET4 0.89 11/21/2020   FREET4 0.77 07/18/2020   FREET4 0.87 03/03/2020   FREET4 0.96 11/19/2019   FREET4 0.90 09/03/2019   FREET4 0.88 08/21/2018   FREET4 0.76 07/11/2017   FREET4 0.96 12/10/2016   FREET4 0.94 06/11/2016   Lab Results  Component Value Date   T3FREE 3.3 02/25/2021   T3FREE 2.9 11/21/2020   T3FREE 3.3 07/18/2020   T3FREE 3.8 03/03/2020   T3FREE 3.1 11/19/2019   T3FREE 3.4 09/03/2019   T3FREE 3.2 08/21/2018   T3FREE 3.0 07/11/2017   T3FREE 3.1 12/10/2016   T3FREE 3.0 06/11/2016   Prev: 03/18/2016: TSH 0.153 (0.2-4.5), free T4 1.1, free T3 3.57 03/18/2015: TSH 0.54 , free T4 1.39, free T3 3.07  08/23/2014: TSH 1.29, free T4 1.34, free T3 2.94 05/08/2014: TSH 0.75, free T4 1.25, free T3 2.87 02/20/2014: TSH 0.29, free T4 1.16,  free T3 2.88 05/02/2013: TSH 0.86 (0.45-4.5), free T4 1.41, free T3 2.73  Thyroid Uptake and scan (03/26/2014): normal uptake, 20.9%; left hot nodule  Thyroid U/S (09/03/2013):  Right thyroid lobe: 66 x 14 x 21 mm. Innumerable hypoechoic/ cystic nodules.   7 x 5.5 x 8.8 mm superior pole - largest 9 x 7 x 11 mm mid lobe 6 x 8 x 8 mm inferior pole Left thyroid lobe: 67 x 14 x 24 mm. Multiple hypoechoic/cystic nodules. 10 x 5 x 10 mm complex, mid lobe - largest 9 x 9 x 9 mm solid, inferior pole Isthmus Thickness: 2.3 mm.  No nodules visualized. Lymphadenopathy: None visualized.  Thyroid U/S - [facility] (07/18/2017): Right thyroid lobe: 4.8 x 1.2 x 2.3 cm.   Mildly heterogeneous parenchyma.  Normal background vascularity. Index hypoechoic nodule in the upper pole measuring 1 x 0.6 x 0.8 cm, unchanged. 2 additional similar sized hypoechoic nodules in the right lobe midpole region are also not significantly changed. Isthmus: 2 mm Left thyroid lobe: 5 x 1 x 2.3 cm. Mildly heterogeneous parenchyma with normal vascularity. Index nearly isoechoic lower pole nodule measuring 1.3 x 1.0 x 0.9 cm, unchanged. Additional slightly smaller nodules are also not significantly changed IMPRESSION: Overall stable appearance of multiple nodules.  Pt denies: - feeling nodules in neck - hoarseness - dysphagia - choking - SOB with lying down  No FH of thyroid cancer. No h/o radiation tx to head or neck. No recent contrast studies. Stopped herbal supplements. No Biotin use. No recent steroids use.   Pt. also has a history of Hep B - Dr. Posey Pronto. On antiviral med. (Tenofovir). She was on OCPs for irregular menses >> now off >> has hot flashes - improved. Now amenorrheic.   ROS: + see HPI I reviewed pt's medications, allergies, PMH, social hx, family hx, and changes were documented in the history of present illness. Otherwise, unchanged from my initial visit note.  Past Medical History:  Diagnosis Date    Liver problem    Thyroid disease    Social History   Social History   Marital status: Married    Spouse name: N/A   Number of children: 2   Occupational History   Housekeeping   Social History Main Topics   Smoking status: Never Smoker   Smokeless tobacco: Never Used   Alcohol use No   Drug use: No   Current Outpatient Medications  Medication Sig Dispense Refill   Tenofovir Alafenamide Fumarate 25 MG TABS Take by mouth.     No current facility-administered medications for this visit.   Allergies  Allergen Reactions   Other     TREE ALLERGY   No family history on file.  PE: BP 110/82 (BP Location: Right Arm, Patient Position: Sitting, Cuff Size: Normal)    Pulse 74    Ht 5\' 7"  (1.702 m)    Wt 148 lb 3.2 oz (67.2 kg)    SpO2 98%    BMI 23.21 kg/m  Wt Readings from Last 3 Encounters:  08/17/21 148 lb 3.2 oz (67.2 kg)  07/18/20 153 lb (69.4 kg)  07/10/18 150 lb (68 kg)   Constitutional: normal weight, in NAD Eyes: PERRLA, EOMI, no exophthalmos ENT: moist mucous membranes, no thyromegaly, no cervical lymphadenopathy Cardiovascular: RRR, No MRG Respiratory: CTA B Musculoskeletal: no deformities, strength intact in all 4 Skin: moist, warm, no rashes Neurological: no tremor with outstretched hands, DTR normal in all 4  ASSESSMENT: 1.  History of subclinical Hyperthyroidism  2. Toxic L adenoma  3. Multinodular thyroid  PLAN:  1. And 2.  Patient with mild intermittent subclinical hypothyroidism.  She did not have thyrotoxic symptoms except for slight anxiety.  No tremors, palpitations, weight loss, increased anxiety.  The hot flashes were most likely related to menopause - they are improved. No other thyrotoxic sxs today.  -Reviewed latest TFTs: TSH was 0.11, with normal free thyroid hormones -Her thyroid uptake and scan showed a left mildly toxic adenoma, however, her TSH was only slightly low, so we decided to follow her expectantly -We will repeat her TFTs  today -I will see her back in 1 year  3. Multinodular thyroid -No neck compression symptoms -Reviewed the latest thyroid ultrasound report from 2019: The nodules were small and they did not appear to be significantly changed compared to the previous evaluation -Plan to repeat another ultrasound now - needs to be checked at the Imaging facility on [place] This Encounter  Procedures   US THYROID   TSH   T4, free   T3, free   Component     Latest Ref Rng & Units 08/17/2021  TSH     0.35 - 5.50 uIU/mL 0.60  T4,Free(Direct)     0.60 - 1.60 ng/dL 0.82  Triiodothyronine,Free,Serum     2.3 - 4.2 pg/mL 3.5  Thyroid tests are now normal.  Addendum (09/09/2021):  Thyroid U/S (09/07/2021)-[facility]: ISTHMUS:  - Size: 1-2 mm as before.   RIGHT LOBE:  - Size: 6.8 x 1.3 x 2.1 cm, previously 4.8 x 1.2 x 2.3 cm.  - Echogenicity: Mild heterogeneity.   LEFT LOBE:  - Size: 6.1 x 1.5 x 2.3 cm, previously 5 x 1 x 2.3 cm.  - Echogenicity: Mild heterogeneity.   NODULES:   - Nodule 1:  -- Size: 1.2 x 0.7 x 1 cm (long x AP x trans). Previously 1 x 0.6 x 0.8  -- Location: Right upper.   -- Composition: solid or almost completely solid (2)  -- Echogenicity: hypoechoic (2)  -- Shape: wider-than-tall (0)  -- Margins: smooth (0)  -- Echogenic foci: none (0)   -- ACR TI-RADS total points and risk category: 4 Points - TR4.   - Nodule 2:  -- Size: 0.9 x 0.7 x 0.8 cm (long x AP x trans). Previously 0.8 x 0.5 x 0.7  -- Location: Right lower.   -- Composition: spongiform (0)  -- Echogenicity: hypoechoic (2)  -- Shape: wider-than-tall (0)  -- Margins:  -- Echogenic foci: none (0)   -- ACR TI-RADS total points and risk category: 2 Points - TR2.   - Nodule 3:  -- Size: 1.5 x 0.8 x 1.4 cm (long x AP x trans).  -- Location: Left mid.   -- Composition: spongiform (0)  -- Echogenicity: hypoechoic (2)  -- Shape: wider-than-tall (0)  -- Margins: smooth (0)  -- Echogenic foci: none  (0)   -- ACR TI-RADS total points and risk category: 2 Points - TR2.   - Nodule 4:  -- Size: 1.6 x 0.8 x 1.1 cm (long x AP x trans).  -- Location: Left mid lateral.   -- Composition: mixed cystic and solid (1)  -- Echogenicity: hypoechoic (2)  -- Shape: wider-than-tall (0)  -- Margins: smooth (0)  -- Echogenic foci: none (0)   -- ACR TI-RADS total points and risk category: 3 Points - TR3.   - Nodule 5:  -- Size: 1 x 0.8 x 1 cm (long x AP x trans). Previously 1.3 x 1 x 0.9  -- Location: Left lower.   -- Composition: mixed cystic and solid (1)  -- Echogenicity: isoechoic (1)  -- Shape: wider-than-tall (0)  -- Margins: ill-defined (0)  -- Echogenic foci: none (0)   -- ACR TI-RADS total points and risk category: 2 Points - TR2.    IMPRESSION:  1.  Nodule 1: ACR TI-RADS 4: Recommendation: Follow-up ultrasound in 1 year.  2.  Nodule 2: ACR TI-RADS 2: Recommendation: No further follow-up is recommended.  3.  Nodule 3: ACR TI-RADS 2: Recommendation: No further follow-up is recommended.  4.  Nodule 4: ACR TI-RADS 3: Recommendation: Follow-up ultrasound in 1 year.  5.  Nodule 5: ACR TI RADS 2: Recommendation: No further follow-up is recommended    ACR TI-RADS recommendations:  TR5 (7 or more points) - FNA if greater than or equal to 1cm; if 0.5 - 0.9 cm follow-up every year for 5 years.  TR4 (4-6 points) - FNA if greater than or equal to 1.5cm; if 1 - 1.4 cm follow-up in 1, 2, 3 and 5 years.  TR3 (3 points) - FNA if greater than or equal to 2.5cm;  if 1.5 - 2.4 cm follow-up in 1, 3 and 5 years.  TR2 (2 points) and TR1 (0 points) - No FNA or follow-up.   # ACR TI-RADS recommends that no  more than two nodules with the highest ACR TI-RADS total point should be biopsied and no more than four nodules should be followed.   No intervention needed for now.  Plan to repeat the ultrasound in a year.  [name], MD PhD [facility] Endocrinology

## 2021-08-17 NOTE — Patient Instructions (Addendum)
Stop at [facility] lab.  Please come back for a follow-up appointment in 1 year.

## 2021-09-09 ENCOUNTER — Encounter: Payer: Self-pay | Admitting: Internal Medicine

## 2021-09-16 ENCOUNTER — Other Ambulatory Visit: Payer: Self-pay | Admitting: Nurse Practitioner

## 2021-09-16 DIAGNOSIS — Z1231 Encounter for screening mammogram for malignant neoplasm of breast: Secondary | ICD-10-CM

## 2021-09-28 ENCOUNTER — Ambulatory Visit
Admission: RE | Admit: 2021-09-28 | Discharge: 2021-09-28 | Disposition: A | Discharge status: Home / Self Care | Payer: Managed Care, Other (non HMO) | Source: Ambulatory Visit | Attending: Nurse Practitioner | Admitting: Nurse Practitioner

## 2021-09-28 ENCOUNTER — Other Ambulatory Visit: Payer: Self-pay

## 2021-09-28 DIAGNOSIS — Z1231 Encounter for screening mammogram for malignant neoplasm of breast: Secondary | ICD-10-CM

## 2022-08-23 ENCOUNTER — Ambulatory Visit: Payer: Managed Care, Other (non HMO) | Admitting: Internal Medicine

## 2022-08-30 ENCOUNTER — Encounter: Payer: Self-pay | Admitting: Internal Medicine

## 2022-08-30 ENCOUNTER — Ambulatory Visit: Payer: Managed Care, Other (non HMO) | Admitting: Internal Medicine

## 2022-08-30 VITALS — BP 118/82 | HR 71 | Ht 67.0 in | Wt 148.4 lb

## 2022-08-30 DIAGNOSIS — E0510 Thyrotoxicosis with toxic single thyroid nodule without thyrotoxic crisis or storm: Secondary | ICD-10-CM | POA: Diagnosis not present

## 2022-08-30 DIAGNOSIS — E042 Nontoxic multinodular goiter: Secondary | ICD-10-CM | POA: Diagnosis not present

## 2022-08-30 LAB — TSH: TSH: 0.36 u[IU]/mL (ref 0.35–5.50)

## 2022-08-30 LAB — T4, FREE: Free T4: 0.92 ng/dL (ref 0.60–1.60)

## 2022-08-30 LAB — T3, FREE: T3, Free: 3.4 pg/mL (ref 2.3–4.2)

## 2022-08-30 NOTE — Progress Notes (Addendum)
Patient ID: Lacey White, female   DOB: 1970-12-07, 52 y.o.   MRN: [id number]   HPI  Lacey White is a 52 y.o.-year-old female, presenting for follow-up for subclinical thyrotoxicosis, toxic L adenoma, and multinodular thyroid.  Last visit 1 year ago.  Interim history: She has chronic hepatitis B - dx'ed in the past year.   She denies: - weight loss - heat intolerance - tremors - palpitations - anxiety She has insomnia in the last 4-5 mo - wakes up and cannot sleep.  On Biotin 300 mcg daily - last dose today.  Reviewed TFTs: Lab Results  Component Value Date   TSH 0.60 08/17/2021   TSH 0.11 (L) 02/25/2021   TSH 0.26 (L) 11/21/2020   TSH 0.25 (L) 07/18/2020   TSH 0.34 (L) 03/03/2020   TSH 0.23 (L) 11/19/2019   TSH 0.28 (L) 09/03/2019   TSH 0.36 08/21/2018   TSH 0.45 07/11/2017   TSH 0.61 12/10/2016   Lab Results  Component Value Date   FREET4 0.82 08/17/2021   FREET4 0.80 02/25/2021   FREET4 0.89 11/21/2020   FREET4 0.77 07/18/2020   FREET4 0.87 03/03/2020   FREET4 0.96 11/19/2019   FREET4 0.90 09/03/2019   FREET4 0.88 08/21/2018   FREET4 0.76 07/11/2017   FREET4 0.96 12/10/2016   Lab Results  Component Value Date   T3FREE 3.5 08/17/2021   T3FREE 3.3 02/25/2021   T3FREE 2.9 11/21/2020   T3FREE 3.3 07/18/2020   T3FREE 3.8 03/03/2020   T3FREE 3.1 11/19/2019   T3FREE 3.4 09/03/2019   T3FREE 3.2 08/21/2018   T3FREE 3.0 07/11/2017   T3FREE 3.1 12/10/2016   Prev: 03/18/2016: TSH 0.153 (0.2-4.5), free T4 1.1, free T3 3.57 03/18/2015: TSH 0.54 , free T4 1.39, free T3 3.07  08/23/2014: TSH 1.29, free T4 1.34, free T3 2.94 05/08/2014: TSH 0.75, free T4 1.25, free T3 2.87 02/20/2014: TSH 0.29, free T4 1.16, free T3 2.88 05/02/2013: TSH 0.86 (0.45-4.5), free T4 1.41, free T3 2.73  Thyroid Uptake and scan (03/26/2014): normal uptake, 20.9%; left hot nodule  Thyroid U/S (09/03/2013):  Right thyroid lobe: 66 x 14 x 21 mm. Innumerable hypoechoic/ cystic nodules.    7 x 5.5 x 8.8 mm superior pole - largest 9 x 7 x 11 mm mid lobe 6 x 8 x 8 mm inferior pole Left thyroid lobe: 67 x 14 x 24 mm. Multiple hypoechoic/cystic nodules. 10 x 5 x 10 mm complex, mid lobe - largest 9 x 9 x 9 mm solid, inferior pole Isthmus Thickness: 2.3 mm.  No nodules visualized. Lymphadenopathy: None visualized.  Thyroid U/S - [facility] (07/18/2017): Right thyroid lobe: 4.8 x 1.2 x 2.3 cm.   Mildly heterogeneous parenchyma.  Normal background vascularity. Index hypoechoic nodule in the upper pole measuring 1 x 0.6 x 0.8 cm, unchanged. 2 additional similar sized hypoechoic nodules in the right lobe midpole region are also not significantly changed. Isthmus: 2 mm Left thyroid lobe: 5 x 1 x 2.3 cm. Mildly heterogeneous parenchyma with normal vascularity. Index nearly isoechoic lower pole nodule measuring 1.3 x 1.0 x 0.9 cm, unchanged. Additional slightly smaller nodules are also not significantly changed IMPRESSION: Overall stable appearance of multiple nodules.  Thyroid U/S (09/07/2021)-[facility]: ISTHMUS:  - Size: 1-2 mm as before.  RIGHT LOBE: - Echogenicity: Mild heterogeneity. - Size: 6.8 x 1.3 x 2.1 cm, previously 4.8 x 1.2 x 2.3 cm.  LEFT LOBE: - Echogenicity: Mild heterogeneity.  - Size: 6.1 x 1.5 x 2.3 cm, previously 5 x 1  x 2.3 cm.   NODULES:  - Nodule 1:  -- Size: 1.2 x 0.7 x 1 cm (long x AP x trans). Previously 1 x 0.6 x 0.8  -- Location: Right upper.  -- Composition: solid or almost completely solid (2)  -- Echogenicity: hypoechoic (2)  -- ACR TI-RADS total points and risk category: 4 Points - TR4.   - Nodule 2:  -- Size: 0.9 x 0.7 x 0.8 cm (long x AP x trans). Previously 0.8 x 0.5 x 0.7  -- Location: Right lower.  -- Echogenicity: hypoechoic (2)  -- ACR TI-RADS total points and risk category: 2 Points - TR2.   - Nodule 3:  -- Size: 1.5 x 0.8 x 1.4 cm (long x AP x trans).  -- Location: Left mid.  -- Composition: spongiform (0)  -- Echogenicity: hypoechoic  (2)  -- ACR TI-RADS total points and risk category: 2 Points - TR2.   - Nodule 4:  -- Size: 1.6 x 0.8 x 1.1 cm (long x AP x trans).  -- Location: Left mid lateral.  -- Composition: mixed cystic and solid (1)  -- Echogenicity: hypoechoic (2)  -- ACR TI-RADS total points and risk category: 3 Points - TR3.   - Nodule 5:  -- Size: 1 x 0.8 x 1 cm (long x AP x trans). Previously 1.3 x 1 x 0.9  -- Location: Left lower.  -- Composition: mixed cystic and solid (1)  -- Echogenicity: isoechoic (1)  -- ACR TI-RADS total points and risk category: 2 Points - TR2.    IMPRESSION:  1.  Nodule 1: ACR TI-RADS 4: Recommendation: Follow-up ultrasound in 1 year.  2.  Nodule 2: ACR TI-RADS 2: Recommendation: No further follow-up is recommended.  3.  Nodule 3: ACR TI-RADS 2: Recommendation: No further follow-up is recommended.  4.  Nodule 4: ACR TI-RADS 3: Recommendation: Follow-up ultrasound in 1 year.  5.  Nodule 5: ACR TI RADS 2: Recommendation: No further follow-up is recommended    ACR TI-RADS recommendations:  TR5 (7 or more points) - FNA if greater than or equal to 1cm; if 0.5 - 0.9 cm follow-up every year for 5 years.  TR4 (4-6 points) - FNA if greater than or equal to 1.5cm; if 1 - 1.4 cm follow-up in 1, 2, 3 and 5 years.  TR3 (3 points) - FNA if greater than or equal to 2.5cm;  if 1.5 - 2.4 cm follow-up in 1, 3 and 5 years.  TR2 (2 points) and TR1 (0 points) - No FNA or follow-up.   # ACR TI-RADS recommends that no more than two nodules with the highest ACR TI-RADS total point should be biopsied and no more than four nodules should be followed.   Pt denies: - feeling nodules in neck - hoarseness - dysphagia - choking  No FH of thyroid cancer. No h/o radiation tx to head or neck. No recent contrast studies. Stopped herbal supplements. No Biotin use. No recent steroids use.   Pt. also has a history of Hep B - Dr. Posey Pronto. On antiviral med. (Tenofovir). She was on OCPs for irregular menses >>  now off >> has hot flashes - improved. Now amenorrheic.   ROS: + see HPI I reviewed pt's medications, allergies, PMH, social hx, family hx, and changes were documented in the history of present illness. Otherwise, unchanged from my initial visit note.  Past Medical History:  Diagnosis Date   Liver problem    Thyroid disease    Social  History   Social History   Marital status: Married    Spouse name: N/A   Number of children: 2   Occupational History   Housekeeping   Social History Main Topics   Smoking status: Never Smoker   Smokeless tobacco: Never Used   Alcohol use No   Drug use: No   Current Outpatient Medications  Medication Sig Dispense Refill   Tenofovir Alafenamide Fumarate 25 MG TABS Take by mouth.     No current facility-administered medications for this visit.   Allergies  Allergen Reactions   Other     TREE ALLERGY   No family history on file.  PE: BP 118/82 (BP Location: Right Arm, Patient Position: Sitting, Cuff Size: Normal)   Pulse 71   Ht '5\' 7"'$  (1.702 m)   Wt 148 lb 6.4 oz (67.3 kg)   SpO2 99%   BMI 23.24 kg/m  Wt Readings from Last 3 Encounters:  08/30/22 148 lb 6.4 oz (67.3 kg)  08/17/21 148 lb 3.2 oz (67.2 kg)  07/18/20 153 lb (69.4 kg)   Constitutional: normal weight, in NAD Eyes:  EOMI, no exophthalmos ENT: Lumpy-bumpy thyroid, no cervical lymphadenopathy Cardiovascular: RRR, No MRG Respiratory: CTA B Musculoskeletal: no deformities Skin:no rashes Neurological: no tremor with outstretched hands  ASSESSMENT: 1.  History of subclinical Hyperthyroidism  2. Toxic L adenoma  3. Multinodular thyroid  PLAN:  1. And 2.  Patient with mild intermittent subclinical hyperthyroidism.  She did not have thyrotoxic symptoms except for slight anxiety.  No tremors, palpitations, weight loss, increased anxiety.  Her hot flashes were most likely related to menopause.  These have improved.  No thyrotoxic symptoms today.  She has insomnia,  possibly related to menopause. -At last visit, her TFTs were all normal -Her thyroid uptake and scan showed a left mildly toxic adenoma, however, her TSH was only slightly low at that time so we decided to follow her expectantly -we we will recheck her TFTs today -I will see her back in 1 year  3. Multinodular thyroid -No neck compression symptoms -Reviewing the thyroid ultrasound report from 2019: The nodules were small and they did not appear to be significantly changed compared to the previous evaluation -We checked another ultrasound after last visit (09/2021 and this still showed small nodules, of which 1 left and 1 right qualified for 1 year follow-up -And repeat another ultrasound now-needs to be checked at the imaging facility on [address] (ordered as External) -will fax the order to the facility   Component     Latest Ref Rng 08/30/2022  Triiodothyronine,Free,Serum     2.3 - 4.2 pg/mL 3.4   T4,Free(Direct)     0.60 - 1.60 ng/dL 0.92   TSH     0.35 - 5.50 uIU/mL 0.36   Normal TFTs.  Thyroid U/S (09/06/2022) - [facility]:     [name], MD PhD [facility] Endocrinology

## 2022-08-30 NOTE — Patient Instructions (Signed)
Stop at the lab.  Please come back for a follow-up appointment in 1 year.

## 2022-09-10 ENCOUNTER — Encounter: Payer: Self-pay | Admitting: Internal Medicine

## 2023-01-12 ENCOUNTER — Other Ambulatory Visit: Payer: Self-pay | Admitting: Obstetrics and Gynecology

## 2023-01-12 ENCOUNTER — Encounter: Payer: Self-pay | Admitting: Allergy

## 2023-01-12 DIAGNOSIS — Z1231 Encounter for screening mammogram for malignant neoplasm of breast: Secondary | ICD-10-CM

## 2023-02-07 ENCOUNTER — Ambulatory Visit: Payer: Managed Care, Other (non HMO)

## 2023-02-09 ENCOUNTER — Ambulatory Visit: Payer: Managed Care, Other (non HMO)

## 2023-02-14 ENCOUNTER — Ambulatory Visit
Admission: RE | Admit: 2023-02-14 | Discharge: 2023-02-14 | Disposition: A | Discharge status: Home / Self Care | Payer: Managed Care, Other (non HMO) | Source: Ambulatory Visit | Attending: Obstetrics and Gynecology | Admitting: Obstetrics and Gynecology

## 2023-02-14 DIAGNOSIS — Z1231 Encounter for screening mammogram for malignant neoplasm of breast: Secondary | ICD-10-CM

## 2023-09-05 ENCOUNTER — Ambulatory Visit: Payer: Managed Care, Other (non HMO) | Admitting: Internal Medicine

## 2023-09-05 ENCOUNTER — Encounter: Payer: Self-pay | Admitting: Internal Medicine

## 2023-09-05 VITALS — BP 120/60 | HR 64 | Ht 67.0 in | Wt 147.2 lb

## 2023-09-05 DIAGNOSIS — E0510 Thyrotoxicosis with toxic single thyroid nodule without thyrotoxic crisis or storm: Secondary | ICD-10-CM

## 2023-09-05 DIAGNOSIS — E042 Nontoxic multinodular goiter: Secondary | ICD-10-CM | POA: Diagnosis not present

## 2023-09-05 LAB — T4, FREE: Free T4: 1.3 ng/dL (ref 0.8–1.8)

## 2023-09-05 LAB — TSH: TSH: 0.12 m[IU]/L — ABNORMAL LOW

## 2023-09-05 LAB — T3, FREE: T3, Free: 3.8 pg/mL (ref 2.3–4.2)

## 2023-09-05 NOTE — Progress Notes (Addendum)
 Patient ID: Lacey White, female   DOB: 1971/06/29, 53 y.o.   MRN: [id number]   HPI  Lacey White is a 53 y.o.-year-old female, presenting for follow-up for subclinical thyrotoxicosis, toxic L adenoma, and multinodular thyroid.  Last visit 1 year ago. Now changed insurances from [place] to [place].  Interim history: At today's visit, she feels well, without complaints except for persistent mild anxiety,  insomnia.  She denies: - weight loss - heat intolerance - tremors - palpitations  On Biotin 300 mcg daily - last dose yesterday.  He also started L-lysine, but not taking it consistently.  Reviewed TFTs: Lab Results  Component Value Date   TSH 0.36 08/30/2022   TSH 0.60 08/17/2021   TSH 0.11 (L) 02/25/2021   TSH 0.26 (L) 11/21/2020   TSH 0.25 (L) 07/18/2020   TSH 0.34 (L) 03/03/2020   TSH 0.23 (L) 11/19/2019   TSH 0.28 (L) 09/03/2019   TSH 0.36 08/21/2018   TSH 0.45 07/11/2017   Lab Results  Component Value Date   FREET4 0.92 08/30/2022   FREET4 0.82 08/17/2021   FREET4 0.80 02/25/2021   FREET4 0.89 11/21/2020   FREET4 0.77 07/18/2020   FREET4 0.87 03/03/2020   FREET4 0.96 11/19/2019   FREET4 0.90 09/03/2019   FREET4 0.88 08/21/2018   FREET4 0.76 07/11/2017   Lab Results  Component Value Date   T3FREE 3.4 08/30/2022   T3FREE 3.5 08/17/2021   T3FREE 3.3 02/25/2021   T3FREE 2.9 11/21/2020   T3FREE 3.3 07/18/2020   T3FREE 3.8 03/03/2020   T3FREE 3.1 11/19/2019   T3FREE 3.4 09/03/2019   T3FREE 3.2 08/21/2018   T3FREE 3.0 07/11/2017   Prev: 03/18/2016: TSH 0.153 (0.2-4.5), free T4 1.1, free T3 3.57 03/18/2015: TSH 0.54 , free T4 1.39, free T3 3.07  08/23/2014: TSH 1.29, free T4 1.34, free T3 2.94 05/08/2014: TSH 0.75, free T4 1.25, free T3 2.87 02/20/2014: TSH 0.29, free T4 1.16, free T3 2.88 05/02/2013: TSH 0.86 (0.45-4.5), free T4 1.41, free T3 2.73  Thyroid Uptake and scan (03/26/2014): normal uptake, 20.9%; left hot nodule  Thyroid U/S (09/03/2013):  Right  thyroid lobe: 66 x 14 x 21 mm. Innumerable hypoechoic/ cystic nodules.   7 x 5.5 x 8.8 mm superior pole - largest 9 x 7 x 11 mm mid lobe 6 x 8 x 8 mm inferior pole Left thyroid lobe: 67 x 14 x 24 mm. Multiple hypoechoic/cystic nodules. 10 x 5 x 10 mm complex, mid lobe - largest 9 x 9 x 9 mm solid, inferior pole Isthmus Thickness: 2.3 mm.  No nodules visualized. Lymphadenopathy: None visualized.  Thyroid U/S - [facility] (07/18/2017): Right thyroid lobe: 4.8 x 1.2 x 2.3 cm.   Mildly heterogeneous parenchyma.  Normal background vascularity. Index hypoechoic nodule in the upper pole measuring 1 x 0.6 x 0.8 cm, unchanged. 2 additional similar sized hypoechoic nodules in the right lobe midpole region are also not significantly changed. Isthmus: 2 mm Left thyroid lobe: 5 x 1 x 2.3 cm. Mildly heterogeneous parenchyma with normal vascularity. Index nearly isoechoic lower pole nodule measuring 1.3 x 1.0 x 0.9 cm, unchanged. Additional slightly smaller nodules are also not significantly changed IMPRESSION: Overall stable appearance of multiple nodules.  Thyroid U/S (09/07/2021)-[facility]: ISTHMUS:  - Size: 1-2 mm as before.  RIGHT LOBE: - Echogenicity: Mild heterogeneity. - Size: 6.8 x 1.3 x 2.1 cm, previously 4.8 x 1.2 x 2.3 cm.  LEFT LOBE: - Echogenicity: Mild heterogeneity.  - Size: 6.1 x 1.5 x 2.3 cm,  previously 5 x 1 x 2.3 cm.   NODULES:  - Nodule 1:  -- Size: 1.2 x 0.7 x 1 cm (long x AP x trans). Previously 1 x 0.6 x 0.8  -- Location: Right upper.  -- Composition: solid or almost completely solid (2)  -- Echogenicity: hypoechoic (2)  -- ACR TI-RADS total points and risk category: 4 Points - TR4.   - Nodule 2:  -- Size: 0.9 x 0.7 x 0.8 cm (long x AP x trans). Previously 0.8 x 0.5 x 0.7  -- Location: Right lower.  -- Echogenicity: hypoechoic (2)  -- ACR TI-RADS total points and risk category: 2 Points - TR2.   - Nodule 3:  -- Size: 1.5 x 0.8 x 1.4 cm (long x AP x trans).  -- Location:  Left mid.  -- Composition: spongiform (0)  -- Echogenicity: hypoechoic (2)  -- ACR TI-RADS total points and risk category: 2 Points - TR2.   - Nodule 4:  -- Size: 1.6 x 0.8 x 1.1 cm (long x AP x trans).  -- Location: Left mid lateral.  -- Composition: mixed cystic and solid (1)  -- Echogenicity: hypoechoic (2)  -- ACR TI-RADS total points and risk category: 3 Points - TR3.   - Nodule 5:  -- Size: 1 x 0.8 x 1 cm (long x AP x trans). Previously 1.3 x 1 x 0.9  -- Location: Left lower.  -- Composition: mixed cystic and solid (1)  -- Echogenicity: isoechoic (1)  -- ACR TI-RADS total points and risk category: 2 Points - TR2.    IMPRESSION:  1.  Nodule 1: ACR TI-RADS 4: Recommendation: Follow-up ultrasound in 1 year.  2.  Nodule 2: ACR TI-RADS 2: Recommendation: No further follow-up is recommended.  3.  Nodule 3: ACR TI-RADS 2: Recommendation: No further follow-up is recommended.  4.  Nodule 4: ACR TI-RADS 3: Recommendation: Follow-up ultrasound in 1 year.  5.  Nodule 5: ACR TI RADS 2: Recommendation: No further follow-up is recommended    ACR TI-RADS recommendations:  TR5 (7 or more points) - FNA if greater than or equal to 1cm; if 0.5 - 0.9 cm follow-up every year for 5 years.  TR4 (4-6 points) - FNA if greater than or equal to 1.5cm; if 1 - 1.4 cm follow-up in 1, 2, 3 and 5 years.  TR3 (3 points) - FNA if greater than or equal to 2.5cm;  if 1.5 - 2.4 cm follow-up in 1, 3 and 5 years.  TR2 (2 points) and TR1 (0 points) - No FNA or follow-up.   # ACR TI-RADS recommends that no more than two nodules with the highest ACR TI-RADS total point should be biopsied and no more than four nodules should be followed.   Thyroid U/S (09/06/2022) - [facility]:     Pt denies: - feeling nodules in neck - hoarseness - dysphagia - choking  No FH of thyroid cancer. No h/o radiation tx to head or neck. No recent contrast studies.  No longer on herbal supplements.  No recent steroids use.   Pt.  also has a history of chronic Hep B - Dr. Allena Katz. On antiviral med. (Tenofovir). She was on OCPs for irregular menses >> now off >> hot flashes - improved. Now amenorrheic.   ROS: + see HPI I reviewed pt's medications, allergies, PMH, social hx, family hx, and changes were documented in the history of present illness. Otherwise, unchanged from my initial visit note.  Past Medical History:  Diagnosis  Date   Liver problem    Thyroid disease    Social History   Social History   Marital status: Married    Spouse name: N/A   Number of children: 2   Occupational History   Housekeeping   Social History Main Topics   Smoking status: Never Smoker   Smokeless tobacco: Never Used   Alcohol use No   Drug use: No   Current Outpatient Medications  Medication Sig Dispense Refill   Tenofovir Alafenamide Fumarate 25 MG TABS Take by mouth.     No current facility-administered medications for this visit.   Allergies  Allergen Reactions   Sulfa Antibiotics Other (See Comments)    Other Reaction(s): unknown reaction   Other     TREE ALLERGY   No family history on file.  PE: BP 120/60   Pulse 64   Ht 5\' 7"  (1.702 m)   Wt 147 lb 3.2 oz (66.8 kg)   SpO2 99%   BMI 23.05 kg/m  Wt Readings from Last 3 Encounters:  09/05/23 147 lb 3.2 oz (66.8 kg)  08/30/22 148 lb 6.4 oz (67.3 kg)  08/17/21 148 lb 3.2 oz (67.2 kg)   Constitutional: normal weight, in NAD Eyes:  EOMI, no exophthalmos ENT: Lumpy-bumpy thyroid, no cervical lymphadenopathy Cardiovascular: RRR, No MRG Respiratory: CTA B Musculoskeletal: no deformities Skin:no rashes Neurological: no tremor with outstretched hands  ASSESSMENT: 1.  Subclinical Hyperthyroidism  2. Toxic L adenoma  3. Multinodular thyroid  PLAN:  1. And 2.  Patient with mild intermittent subclinical hyperthyroidism.  She does not have thyrotoxic symptoms except for slight anxiety.  No tremors, palpitations, weight loss.  At last visit, her hot  flashes were improved.  No thyrotoxic symptoms today other than insomnia and anxiety, possibly related to stress as she mentions that she has a lot of stress at work. Insomnia is possibly also related to menopause -Her thyroid uptake and scan showed a LEFT mildly toxic adenoma, however, her TSH was only slightly low at that time so we decided to follow her expectantly.  Indeed, her TFTs returned normal at last visit. -I will recheck a TSH, fT4 and fT3 today -I will see her back in 1 year  3. Multinodular thyroid -No neck compression symptoms -Reviewed latest ultrasound from 09/06/2022 ([facility]): Nodules were not significantly changed.  Only the left mid thyroid nodules (1.7 and 1.1 cm, respectively) met criteria for annual follow-up. -Reviewed the previous ultrasound from 2023, one of the left nodules and one of the right nodules qualified for 1 year follow-up -At last visit she wanted me to send the order for the ultrasound to the imaging facility on [address].  We had to fax the order to the facility.  At today's visit, she prefers to get the U/S done there again -although she changed insurances since last visit, now [employer].  [employer] declined initially pain for her last visit as they mentioned that I was not in network, however, patient called them and the issue was clarified as I am in network with them.  At today's visit, we discussed that I am also in network with [facility]. -At today's visit we discussed that we need another ultrasound -ordered today  Orders Placed This Encounter  Procedures   US THYROID   TSH   T4, free   T3, free   Component     Latest Ref Rng 09/05/2023  Triiodothyronine,Free,Serum     2.3 - 4.2 pg/mL  3.8   T4,Free(Direct)     0.8 - 1.8 ng/dL 1.3   TSH     mIU/L 1.61 (L)   TSH is mildly suppressed again.  Free thyroid hormones are normal.  Message sent: Dear Victorino Dike, Your TSH is mildly suppressed again.  Free thyroid hormones are  normal. The anxiety and insomnia could be related to the high thyroid hormone in your system.  At this point, we could go ahead and start a low-dose medication to see if thyroid tests and your symptoms improve.  I would suggest 5 mg of methimazole daily.  Please let me know if you agree to start this.  The only drawback is that the methimazole can sometimes affect the liver.  This would be very rare, however.   Another option is to do a radioactive iodine treatment in which we gave you an iodine pill with a radioactive tag, that wipes out the thyroid.  There is a chance of you becoming hypothyroid afterwards and needing levothyroxine but this is better tolerated condition. The third option is to  just follow the labs and repeat them in about 2 to 3 months and see if they improve.   Please let me know your thoughts. Sincerely, Carlus Pavlov MD  Pt replied that she will work on reducing gluten and avoid medicines for now.  Thyroid U/S (09/14/2023):   Unfortunately, I do not have the images of her ultrasound, but per review of the report, 2 of the nodules, 1 in the right inferior lobe and the other in the left superior lobe qualified for biopsy.  I will check with her if she agrees with this.  Carlus Pavlov, MD PhD [facility]

## 2023-09-05 NOTE — Patient Instructions (Signed)
 Please stop at the lab.  Please come back for a follow-up appointment in 1 year.

## 2023-09-06 ENCOUNTER — Encounter: Payer: Self-pay | Admitting: Internal Medicine

## 2023-09-14 DIAGNOSIS — E0510 Thyrotoxicosis with toxic single thyroid nodule without thyrotoxic crisis or storm: Secondary | ICD-10-CM | POA: Diagnosis not present

## 2023-09-22 DIAGNOSIS — Z0000 Encounter for general adult medical examination without abnormal findings: Secondary | ICD-10-CM | POA: Diagnosis not present

## 2023-09-22 DIAGNOSIS — H6091 Unspecified otitis externa, right ear: Secondary | ICD-10-CM | POA: Diagnosis not present

## 2023-09-22 DIAGNOSIS — B181 Chronic viral hepatitis B without delta-agent: Secondary | ICD-10-CM | POA: Diagnosis not present

## 2023-09-22 DIAGNOSIS — E038 Other specified hypothyroidism: Secondary | ICD-10-CM | POA: Diagnosis not present

## 2023-09-22 DIAGNOSIS — Z13220 Encounter for screening for lipoid disorders: Secondary | ICD-10-CM | POA: Diagnosis not present

## 2023-09-23 ENCOUNTER — Encounter: Payer: Self-pay | Admitting: Internal Medicine

## 2023-09-27 ENCOUNTER — Other Ambulatory Visit: Payer: Self-pay | Admitting: Internal Medicine

## 2023-09-27 DIAGNOSIS — E042 Nontoxic multinodular goiter: Secondary | ICD-10-CM

## 2023-09-29 ENCOUNTER — Telehealth: Payer: Self-pay | Admitting: Dietician

## 2023-09-29 ENCOUNTER — Telehealth: Payer: Self-pay

## 2023-09-29 NOTE — Telephone Encounter (Signed)
 Patient's husband called re:  referral/order for thyroid biopsy was sent to the wrong place.  He also states that this may need a prior authorization.  Messaged MA.  Oran Rein, RD, LDN, CDCES, DipACLM

## 2023-09-29 NOTE — Telephone Encounter (Signed)
 Please resend referral information to [facility]. Patient states that they haven't received.

## 2023-09-30 NOTE — Telephone Encounter (Signed)
Orders have been faxed to the requested location

## 2023-10-10 DIAGNOSIS — E042 Nontoxic multinodular goiter: Secondary | ICD-10-CM | POA: Diagnosis not present

## 2023-10-12 ENCOUNTER — Encounter: Payer: Self-pay | Admitting: Internal Medicine

## 2023-10-28 DIAGNOSIS — H61893 Other specified disorders of external ear, bilateral: Secondary | ICD-10-CM | POA: Diagnosis not present

## 2024-02-13 ENCOUNTER — Other Ambulatory Visit: Payer: Self-pay | Admitting: Obstetrics and Gynecology

## 2024-02-13 DIAGNOSIS — Z1231 Encounter for screening mammogram for malignant neoplasm of breast: Secondary | ICD-10-CM

## 2024-02-16 ENCOUNTER — Ambulatory Visit
Admission: RE | Admit: 2024-02-16 | Discharge: 2024-02-16 | Disposition: A | Discharge status: Home / Self Care | Source: Ambulatory Visit | Attending: Obstetrics and Gynecology | Admitting: Obstetrics and Gynecology

## 2024-02-16 DIAGNOSIS — Z1231 Encounter for screening mammogram for malignant neoplasm of breast: Secondary | ICD-10-CM

## 2024-03-14 ENCOUNTER — Telehealth: Payer: Self-pay | Admitting: Internal Medicine

## 2024-03-14 DIAGNOSIS — E0510 Thyrotoxicosis with toxic single thyroid nodule without thyrotoxic crisis or storm: Secondary | ICD-10-CM

## 2024-03-14 NOTE — Telephone Encounter (Signed)
 Patient is calling saying that received a message that due to her abnormal labs she needed to schedule a lab appointment.  Does she need an appointment?  No lab orders are in system at this time.

## 2024-03-15 NOTE — Addendum Note (Signed)
 Addended by: CLEOTILDE ROLIN RAMAN on: 03/15/2024 03:15 PM   Modules accepted: Orders

## 2024-03-16 ENCOUNTER — Other Ambulatory Visit

## 2024-03-16 LAB — T4, FREE: Free T4: 1.2 ng/dL (ref 0.8–1.8)

## 2024-03-16 LAB — T3, FREE: T3, Free: 3.3 pg/mL (ref 2.3–4.2)

## 2024-03-16 LAB — TSH: TSH: 0.15 m[IU]/L — ABNORMAL LOW

## 2024-03-17 ENCOUNTER — Ambulatory Visit: Payer: Self-pay | Admitting: Internal Medicine

## 2024-07-06 ENCOUNTER — Other Ambulatory Visit

## 2024-07-06 ENCOUNTER — Ambulatory Visit: Admitting: Internal Medicine

## 2024-07-06 ENCOUNTER — Encounter: Payer: Self-pay | Admitting: Internal Medicine

## 2024-07-06 VITALS — BP 112/60 | HR 80 | Ht 67.0 in | Wt 127.6 lb

## 2024-07-06 DIAGNOSIS — E042 Nontoxic multinodular goiter: Secondary | ICD-10-CM | POA: Diagnosis not present

## 2024-07-06 DIAGNOSIS — R1319 Other dysphagia: Secondary | ICD-10-CM | POA: Diagnosis not present

## 2024-07-06 DIAGNOSIS — E0510 Thyrotoxicosis with toxic single thyroid nodule without thyrotoxic crisis or storm: Secondary | ICD-10-CM | POA: Diagnosis not present

## 2024-07-06 NOTE — Patient Instructions (Addendum)
Please stop at the lab.  Please come back for a follow-up appointment in 6 months.  
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
## 2024-07-07 ENCOUNTER — Encounter: Payer: Self-pay | Admitting: Internal Medicine

## 2024-07-07 LAB — TSH: TSH: 0.09 m[IU]/L — ABNORMAL LOW

## 2024-07-07 LAB — T3, FREE: T3, Free: 2.9 pg/mL (ref 2.3–4.2)

## 2024-07-07 LAB — T4, FREE: Free T4: 1.2 ng/dL (ref 0.8–1.8)

## 2024-07-09 ENCOUNTER — Ambulatory Visit: Payer: Self-pay | Admitting: Internal Medicine

## 2024-07-09 MED ORDER — METHIMAZOLE 5 MG PO TABS: 5.0000 mg | ORAL_TABLET | Freq: Every day | ORAL | 5 refills | Status: AC

## 2024-07-09 NOTE — Addendum Note (Signed)
 Addended by: TRIXIE FILE on: 07/09/2024 03:10 PM   Modules accepted: Orders

## 2024-07-10 ENCOUNTER — Other Ambulatory Visit: Payer: Self-pay | Admitting: Internal Medicine

## 2024-07-10 DIAGNOSIS — B181 Chronic viral hepatitis B without delta-agent: Secondary | ICD-10-CM

## 2024-08-17 ENCOUNTER — Other Ambulatory Visit

## 2024-08-21 ENCOUNTER — Other Ambulatory Visit

## 2024-08-27 ENCOUNTER — Other Ambulatory Visit

## 2024-09-10 ENCOUNTER — Ambulatory Visit: Admitting: Internal Medicine

## 2024-12-28 ENCOUNTER — Ambulatory Visit: Admitting: Internal Medicine
